# Patient Record
Sex: Male | Born: 1955 | Race: White | Hispanic: No | Marital: Married | Smoking: Never smoker
Health system: Southern US, Community
[De-identification: ages and names within clinical notes are randomized; demographics above are authoritative.]

## PROBLEM LIST (undated history)

## (undated) DIAGNOSIS — G510 Bell's palsy: Secondary | ICD-10-CM

---

## 2006-03-22 ENCOUNTER — Ambulatory Visit: Payer: Self-pay | Admitting: Internal Medicine

## 2006-03-22 LAB — CONVERTED CEMR LAB
ALT: 23 units/L (ref 0–40)
Alkaline Phosphatase: 64 units/L (ref 39–117)
BUN: 17 mg/dL (ref 6–23)
Basophils Relative: 0.5 % (ref 0.0–1.0)
Bilirubin Urine: NEGATIVE
CO2: 28 meq/L (ref 19–32)
Calcium: 9.1 mg/dL (ref 8.4–10.5)
Chloride: 109 meq/L (ref 96–112)
Chol/HDL Ratio, serum: 5.1
Cholesterol: 220 mg/dL (ref 0–200)
HDL: 43.4 mg/dL (ref >39.0)
Hemoglobin, Urine: NEGATIVE
Hemoglobin: 14.5 g/dL (ref 13.0–17.0)
Ketones, ur: NEGATIVE mg/dL
LDL DIRECT: 148.2 mg/dL
Lymphocytes Relative: 34.1 % (ref 12.0–46.0)
Neutrophils Relative %: 53.9 % (ref 43.0–77.0)
Potassium: 4.2 meq/L (ref 3.5–5.1)
TSH: 2.2 microintl units/mL (ref 0.35–5.50)
Total Protein: 6.6 g/dL (ref 6.0–8.3)
Urobilinogen, UA: 0.2 (ref 0.0–1.0)
WBC: 4.9 10*3/uL (ref 4.5–10.5)

## 2006-03-29 ENCOUNTER — Ambulatory Visit: Payer: Self-pay | Admitting: Internal Medicine

## 2007-08-14 ENCOUNTER — Encounter: Payer: Self-pay | Admitting: *Deleted

## 2007-08-14 DIAGNOSIS — Z87442 Personal history of urinary calculi: Secondary | ICD-10-CM

## 2016-08-26 ENCOUNTER — Encounter (INDEPENDENT_AMBULATORY_CARE_PROVIDER_SITE_OTHER): Payer: PRIVATE HEALTH INSURANCE | Admitting: Ophthalmology

## 2016-08-26 DIAGNOSIS — H43813 Vitreous degeneration, bilateral: Secondary | ICD-10-CM | POA: Diagnosis not present

## 2016-08-26 DIAGNOSIS — H3551 Vitreoretinal dystrophy: Secondary | ICD-10-CM

## 2016-08-26 DIAGNOSIS — D3131 Benign neoplasm of right choroid: Secondary | ICD-10-CM

## 2016-10-07 ENCOUNTER — Encounter (INDEPENDENT_AMBULATORY_CARE_PROVIDER_SITE_OTHER): Payer: PRIVATE HEALTH INSURANCE | Admitting: Ophthalmology

## 2016-10-20 ENCOUNTER — Encounter (INDEPENDENT_AMBULATORY_CARE_PROVIDER_SITE_OTHER): Payer: PRIVATE HEALTH INSURANCE | Admitting: Ophthalmology

## 2016-10-31 ENCOUNTER — Encounter (INDEPENDENT_AMBULATORY_CARE_PROVIDER_SITE_OTHER): Payer: PRIVATE HEALTH INSURANCE | Admitting: Ophthalmology

## 2016-11-03 ENCOUNTER — Encounter (INDEPENDENT_AMBULATORY_CARE_PROVIDER_SITE_OTHER): Payer: PRIVATE HEALTH INSURANCE | Admitting: Ophthalmology

## 2016-11-03 DIAGNOSIS — H43813 Vitreous degeneration, bilateral: Secondary | ICD-10-CM

## 2016-11-03 DIAGNOSIS — D3131 Benign neoplasm of right choroid: Secondary | ICD-10-CM

## 2018-03-14 DIAGNOSIS — H01004 Unspecified blepharitis left upper eyelid: Secondary | ICD-10-CM | POA: Diagnosis not present

## 2018-03-14 DIAGNOSIS — H01001 Unspecified blepharitis right upper eyelid: Secondary | ICD-10-CM | POA: Diagnosis not present

## 2018-03-14 DIAGNOSIS — H1132 Conjunctival hemorrhage, left eye: Secondary | ICD-10-CM | POA: Diagnosis not present

## 2019-02-07 ENCOUNTER — Other Ambulatory Visit: Payer: Self-pay

## 2019-02-07 ENCOUNTER — Emergency Department (HOSPITAL_COMMUNITY): Payer: BC Managed Care – PPO

## 2019-02-07 ENCOUNTER — Inpatient Hospital Stay (HOSPITAL_COMMUNITY)
Admission: EM | Admit: 2019-02-07 | Discharge: 2019-02-11 | DRG: 042 | Disposition: A | Discharge status: Home / Self Care | Payer: BC Managed Care – PPO | Attending: Internal Medicine | Admitting: Internal Medicine

## 2019-02-07 ENCOUNTER — Encounter (HOSPITAL_COMMUNITY): Payer: Self-pay | Admitting: Emergency Medicine

## 2019-02-07 DIAGNOSIS — Z823 Family history of stroke: Secondary | ICD-10-CM | POA: Diagnosis not present

## 2019-02-07 DIAGNOSIS — I454 Nonspecific intraventricular block: Secondary | ICD-10-CM | POA: Diagnosis not present

## 2019-02-07 DIAGNOSIS — I77810 Thoracic aortic ectasia: Secondary | ICD-10-CM | POA: Diagnosis not present

## 2019-02-07 DIAGNOSIS — R200 Anesthesia of skin: Secondary | ICD-10-CM | POA: Diagnosis not present

## 2019-02-07 DIAGNOSIS — G8324 Monoplegia of upper limb affecting left nondominant side: Secondary | ICD-10-CM | POA: Diagnosis present

## 2019-02-07 DIAGNOSIS — I6389 Other cerebral infarction: Secondary | ICD-10-CM | POA: Diagnosis not present

## 2019-02-07 DIAGNOSIS — R202 Paresthesia of skin: Secondary | ICD-10-CM | POA: Diagnosis not present

## 2019-02-07 DIAGNOSIS — I639 Cerebral infarction, unspecified: Secondary | ICD-10-CM | POA: Diagnosis present

## 2019-02-07 DIAGNOSIS — Z20828 Contact with and (suspected) exposure to other viral communicable diseases: Secondary | ICD-10-CM | POA: Diagnosis present

## 2019-02-07 DIAGNOSIS — Z6829 Body mass index (BMI) 29.0-29.9, adult: Secondary | ICD-10-CM | POA: Diagnosis not present

## 2019-02-07 DIAGNOSIS — G459 Transient cerebral ischemic attack, unspecified: Secondary | ICD-10-CM | POA: Diagnosis present

## 2019-02-07 DIAGNOSIS — R29700 NIHSS score 0: Secondary | ICD-10-CM | POA: Diagnosis present

## 2019-02-07 DIAGNOSIS — I63411 Cerebral infarction due to embolism of right middle cerebral artery: Secondary | ICD-10-CM | POA: Diagnosis present

## 2019-02-07 DIAGNOSIS — I6381 Other cerebral infarction due to occlusion or stenosis of small artery: Principal | ICD-10-CM | POA: Diagnosis present

## 2019-02-07 DIAGNOSIS — Z8669 Personal history of other diseases of the nervous system and sense organs: Secondary | ICD-10-CM | POA: Diagnosis not present

## 2019-02-07 DIAGNOSIS — E785 Hyperlipidemia, unspecified: Secondary | ICD-10-CM | POA: Diagnosis present

## 2019-02-07 DIAGNOSIS — E669 Obesity, unspecified: Secondary | ICD-10-CM | POA: Diagnosis present

## 2019-02-07 DIAGNOSIS — R531 Weakness: Secondary | ICD-10-CM | POA: Diagnosis not present

## 2019-02-07 DIAGNOSIS — R079 Chest pain, unspecified: Secondary | ICD-10-CM | POA: Diagnosis not present

## 2019-02-07 HISTORY — DX: Bell's palsy: G51.0

## 2019-02-07 LAB — BASIC METABOLIC PANEL
Anion gap: 7 (ref 5–15)
BUN: 15 mg/dL (ref 8–23)
CO2: 28 mmol/L (ref 22–32)
Calcium: 9.1 mg/dL (ref 8.9–10.3)
Chloride: 104 mmol/L (ref 98–111)
Creatinine, Ser: 1.04 mg/dL (ref 0.61–1.24)
GFR calc Af Amer: >60 mL/min (ref >60)
GFR calc non Af Amer: >60 mL/min (ref >60)
Glucose, Bld: 102 mg/dL — ABNORMAL HIGH (ref 70–99)
Potassium: 3.9 mmol/L (ref 3.5–5.1)
Sodium: 139 mmol/L (ref 135–145)

## 2019-02-07 LAB — CBC
HCT: 43.2 % (ref 39.0–52.0)
Hemoglobin: 14.3 g/dL (ref 13.0–17.0)
MCH: 32.7 pg (ref 26.0–34.0)
MCHC: 33.1 g/dL (ref 30.0–36.0)
MCV: 98.9 fL (ref 80.0–100.0)
Platelets: 219 10*3/uL (ref 150–400)
RBC: 4.37 MIL/uL (ref 4.22–5.81)
RDW: 13 % (ref 11.5–15.5)
WBC: 8.6 10*3/uL (ref 4.0–10.5)
nRBC: 0 % (ref 0.0–0.2)

## 2019-02-07 LAB — TROPONIN I (HIGH SENSITIVITY): Troponin I (High Sensitivity): 4 ng/L (ref <18)

## 2019-02-07 MED ORDER — ASPIRIN 81 MG PO CHEW
324.0000 mg | CHEWABLE_TABLET | Freq: Once | ORAL | Status: AC
  Administered 2019-02-07: 324 mg via ORAL
  Filled 2019-02-07: qty 4

## 2019-02-07 MED ORDER — CLOPIDOGREL BISULFATE 300 MG PO TABS
300.0000 mg | ORAL_TABLET | Freq: Once | ORAL | Status: AC
  Administered 2019-02-07: 300 mg via ORAL
  Filled 2019-02-07: qty 1

## 2019-02-07 NOTE — ED Provider Notes (Addendum)
63 year old male with no pertinent past medical history presents the ER for evaluation of left arm tingling and numbness with some left-sided chest pressure.  Patient states that it moved from his left hand to his left bicep.  Has been intermittent.  States it is not numb necessarily right now but just feels tingly.  Onset about 4 PM today.  I was notified by nursing staff to evaluate patient for possibility of code stroke.  On my examination neuro exam was performed. The patient is alert, attentive, and oriented x 3. Speech is clear. Cranial nerve II-VII grossly intact.  Peripheral vision intact.  Negative pronator drift. Sensation intact. Strength 5/5 in all extremities. Reflexes 2+ and symmetric at biceps, triceps, knees, and ankles.  No pronator drift.  Rapid alternating movement and fine finger movements intact.  Sensation is intact to his left arm.  Strength normal.  Radial pulses are 2+ bilaterally.  Capillary refill normal.  Given the patient having some chest pressure I discussed with nursing staff to start cardiac work-up for patient.  Do not feel that patient symptoms are secondary to stroke at this time.  Will need further work-up in the ER. Case dicussed with Dr. Roderic Palau who is also aware of patient.   Doristine Devoid, PA-C 02/07/19 1812    Doristine Devoid, PA-C 02/07/19 [place]    Milton Ferguson, MD 02/09/19 1038

## 2019-02-07 NOTE — ED Notes (Signed)
Pt add left sided chest pressure and "not feeling right".

## 2019-02-07 NOTE — ED Notes (Signed)
Made Tyler PA aware of pt's complaint. PA at bedside assessing patient.

## 2019-02-07 NOTE — ED Provider Notes (Signed)
[facility] DEPT Provider Note: Georgena Spurling, MD, FACEP  CSN: [id number] MRN: [id number] ARRIVAL: 02/07/19 at [facility]  02/07/19 10:58 PM Joshua Mayer is a 63 y.o. male without significant past medical history.  He has been having episodes of left upper extremity numbness and weakness beginning about 4 PM.  He estimates he has had 5 episodes, each lasting approximately 10 minutes.  The episodes consist of numbness in his left hand that sometimes extends proximally to the left shoulder.  The symptoms are severe enough that he has been completely insensate in his hand at times.  There is also associated weakness characterized as difficulty moving his hand and arm.  He has had some associated pressure in his left upper chest but denies of frank pain.  He has had no associated headache, shortness of breath, nausea, vomiting, diarrhea or diaphoresis.  He has had no involvement of the face or left lower extremity.  He has a remote history of Bell's palsy which is essentially fully resolved.  He is noted some slight swelling of his lower legs over the past week, exacerbated by prolonged sitting.    Past Medical History:  Diagnosis Date  . Bell's palsy     History reviewed. No pertinent surgical history.  No family history on file.  Social History   Tobacco Use  . Smoking status: Never Smoker  . Smokeless tobacco: Never Used  Substance Use Topics  . Alcohol use: Not Currently  . Drug use: Not on file    Prior to Admission medications   Not on File    Allergies Patient has no known allergies.   REVIEW OF SYSTEMS  Negative except as noted here or in the History of Present Illness.   PHYSICAL EXAMINATION  Initial Vital Signs Blood pressure 138/87, pulse (!) 57, temperature 98.3 F (36.8 C), temperature source Oral, resp. rate 14, SpO2 100 %.  Examination General: Well-developed, well-nourished  male in no acute distress; appearance consistent with age of record HENT: normocephalic; atraumatic Eyes: pupils equal, round and reactive to light; extraocular muscles intact Neck: supple; no bruit Heart: regular rate and rhythm; no murmur Lungs: clear to auscultation bilaterally Abdomen: soft; nondistended; nontender; bowel sounds present Extremities: No deformity; full range of motion; pulses normal Neurologic: Awake, alert and oriented; motor function intact in all extremities and symmetric; sensation intact in extremities and symmetric; no facial droop; no pronator drift; normal finger-to-nose; normal coordination and speech Skin: Warm and dry Psychiatric: Normal mood and affect   RESULTS  Summary of this visit's results, reviewed by myself:   EKG Interpretation  Date/Time:  Thursday February 07 2019 18:07:41 EDT Ventricular Rate:  61 PR Interval:    QRS Duration: 112 QT Interval:  406 QTC Calculation: 409 R Axis:   24 Text Interpretation:  Sinus rhythm Borderline intraventricular conduction delay Consider RVH or posterior infarct No previous ECGs available Confirmed by Vanilla Heatherington, Jenny Reichmann [phone]) on 02/07/2019 10:59:33 PM      Laboratory Studies: Results for orders placed or performed during the hospital encounter of 02/07/19 (from the past 24 hour(s))  Basic metabolic panel     Status: Abnormal   Collection Time: 02/07/19  6:10 PM  Result Value Ref Range   Sodium 139 135 - 145 mmol/L   Potassium 3.9 3.5 - 5.1 mmol/L   Chloride 104 98 - 111 mmol/L   CO2 28 22 - 32 mmol/L   Glucose,  Bld 102 (H) 70 - 99 mg/dL   BUN 15 8 - 23 mg/dL   Creatinine, Ser 1.04 0.61 - 1.24 mg/dL   Calcium 9.1 8.9 - 10.3 mg/dL   GFR calc non Af Amer >60 >60 mL/min   GFR calc Af Amer >60 >60 mL/min   Anion gap 7 5 - 15  CBC     Status: None   Collection Time: 02/07/19  6:10 PM  Result Value Ref Range   WBC 8.6 4.0 - 10.5 K/uL   RBC 4.37 4.22 - 5.81 MIL/uL   Hemoglobin 14.3 13.0 - 17.0 g/dL   HCT  43.2 39.0 - 52.0 %   MCV 98.9 80.0 - 100.0 fL   MCH 32.7 26.0 - 34.0 pg   MCHC 33.1 30.0 - 36.0 g/dL   RDW 13.0 11.5 - 15.5 %   Platelets 219 150 - 400 K/uL   nRBC 0.0 0.0 - 0.2 %  Troponin I (High Sensitivity)     Status: None   Collection Time: 02/07/19  6:10 PM  Result Value Ref Range   Troponin I (High Sensitivity) 4 <18 ng/L   Imaging Studies: Dg Chest 2 View  Result Date: 02/07/2019 CLINICAL DATA:  Chest pain EXAM: CHEST - 2 VIEW COMPARISON:  None. FINDINGS: Normal heart size and vascularity. No acute airspace process, pneumonia, collapse or consolidation. Negative for edema, effusion, or pneumothorax. Suspect left apical subpleural nodular scarring. No acute osseous finding. Trachea midline. IMPRESSION: No acute chest process. Electronically Signed   By: Jerilynn Mages.  Shick M.D.   On: 02/07/2019 19:19   Ct Head Wo Contrast  Result Date: 02/07/2019 CLINICAL DATA:  Acute upper extremity paresthesias EXAM: CT HEAD WITHOUT CONTRAST TECHNIQUE: Contiguous axial images were obtained from the base of the skull through the vertex without intravenous contrast. COMPARISON:  None. FINDINGS: Brain: Minor atrophy pattern. No acute intracranial hemorrhage, mass lesion, definite new infarction, midline shift, herniation, hydrocephalus, or extra-axial fluid collection. No focal mass effect or edema. Cisterns are patent. No gross cerebellar abnormality. Vascular: No hyperdense vessel. Skull: Normal. Negative for fracture or focal lesion. Sinuses/Orbits: No acute finding. Other: None. IMPRESSION: Minor atrophy.  No acute intracranial abnormality by noncontrast CT. Electronically Signed   By: Jerilynn Mages.  Shick M.D.   On: 02/07/2019 19:22    ED COURSE and MDM  Nursing notes and initial vitals signs, including pulse oximetry, reviewed.  Vitals:   02/07/19 1749 02/07/19 2240  BP: (!) 161/89 138/87  Pulse: 65 (!) 57  Resp: 16 14  Temp: 98.3 F (36.8 C)   TempSrc: Oral   SpO2: 100% 100%   11:32 PM Discussed with  Dr. Katherine Roan of neuro hospitalist service.  He recommends we load the patient with aspirin and Plavix and transfer him to [facility] for further TIA evaluation.  PROCEDURES   CRITICAL CARE Performed by: Karen Chafe Ciel Chervenak Total critical care time: 30 minutes Critical care time was exclusive of separately billable procedures and treating other patients. Critical care was necessary to treat or prevent imminent or life-threatening deterioration. Critical care was time spent personally by me on the following activities: development of treatment plan with patient and/or surrogate as well as nursing, discussions with consultants, evaluation of patient's response to treatment, examination of patient, obtaining history from patient or surrogate, ordering and performing treatments and interventions, ordering and review of laboratory studies, ordering and review of radiographic studies, pulse oximetry and re-evaluation of patient's condition.   ED DIAGNOSES     ICD-10-CM  1. TIA (transient ischemic attack)  G45.9        Zaiyden Strozier, MD 02/08/19 [phone]

## 2019-02-07 NOTE — ED Triage Notes (Signed)
Pt reports left arm tingling/numbness since around 4pm today.  Also had intermittent bilat ankle swelling.

## 2019-02-07 NOTE — ED Notes (Signed)
Patient transported to CT 

## 2019-02-08 ENCOUNTER — Observation Stay (HOSPITAL_COMMUNITY): Payer: BC Managed Care – PPO

## 2019-02-08 ENCOUNTER — Encounter (HOSPITAL_COMMUNITY): Payer: Self-pay

## 2019-02-08 ENCOUNTER — Observation Stay (HOSPITAL_BASED_OUTPATIENT_CLINIC_OR_DEPARTMENT_OTHER): Payer: BC Managed Care – PPO

## 2019-02-08 DIAGNOSIS — I63411 Cerebral infarction due to embolism of right middle cerebral artery: Secondary | ICD-10-CM | POA: Diagnosis present

## 2019-02-08 DIAGNOSIS — G459 Transient cerebral ischemic attack, unspecified: Secondary | ICD-10-CM | POA: Diagnosis not present

## 2019-02-08 DIAGNOSIS — R200 Anesthesia of skin: Secondary | ICD-10-CM | POA: Diagnosis not present

## 2019-02-08 DIAGNOSIS — I639 Cerebral infarction, unspecified: Secondary | ICD-10-CM | POA: Diagnosis not present

## 2019-02-08 DIAGNOSIS — E669 Obesity, unspecified: Secondary | ICD-10-CM | POA: Diagnosis present

## 2019-02-08 DIAGNOSIS — I6389 Other cerebral infarction: Secondary | ICD-10-CM | POA: Diagnosis not present

## 2019-02-08 DIAGNOSIS — I77810 Thoracic aortic ectasia: Secondary | ICD-10-CM | POA: Diagnosis present

## 2019-02-08 DIAGNOSIS — Z823 Family history of stroke: Secondary | ICD-10-CM | POA: Diagnosis not present

## 2019-02-08 DIAGNOSIS — Z20828 Contact with and (suspected) exposure to other viral communicable diseases: Secondary | ICD-10-CM | POA: Diagnosis present

## 2019-02-08 DIAGNOSIS — I6381 Other cerebral infarction due to occlusion or stenosis of small artery: Secondary | ICD-10-CM | POA: Diagnosis present

## 2019-02-08 DIAGNOSIS — R202 Paresthesia of skin: Secondary | ICD-10-CM | POA: Diagnosis present

## 2019-02-08 DIAGNOSIS — R531 Weakness: Secondary | ICD-10-CM | POA: Diagnosis present

## 2019-02-08 DIAGNOSIS — E785 Hyperlipidemia, unspecified: Secondary | ICD-10-CM

## 2019-02-08 DIAGNOSIS — I454 Nonspecific intraventricular block: Secondary | ICD-10-CM | POA: Diagnosis present

## 2019-02-08 DIAGNOSIS — Z8669 Personal history of other diseases of the nervous system and sense organs: Secondary | ICD-10-CM | POA: Diagnosis not present

## 2019-02-08 DIAGNOSIS — R29700 NIHSS score 0: Secondary | ICD-10-CM | POA: Diagnosis present

## 2019-02-08 DIAGNOSIS — G8324 Monoplegia of upper limb affecting left nondominant side: Secondary | ICD-10-CM | POA: Diagnosis present

## 2019-02-08 DIAGNOSIS — Z6829 Body mass index (BMI) 29.0-29.9, adult: Secondary | ICD-10-CM | POA: Diagnosis not present

## 2019-02-08 LAB — HIV ANTIBODY (ROUTINE TESTING W REFLEX): HIV Screen 4th Generation wRfx: NONREACTIVE

## 2019-02-08 LAB — COMPREHENSIVE METABOLIC PANEL
ALT: 27 U/L (ref 0–44)
AST: 21 U/L (ref 15–41)
Albumin: 3.9 g/dL (ref 3.5–5.0)
Alkaline Phosphatase: 64 U/L (ref 38–126)
Anion gap: 9 (ref 5–15)
BUN: 13 mg/dL (ref 8–23)
CO2: 26 mmol/L (ref 22–32)
Calcium: 8.9 mg/dL (ref 8.9–10.3)
Chloride: 105 mmol/L (ref 98–111)
Creatinine, Ser: 0.99 mg/dL (ref 0.61–1.24)
GFR calc Af Amer: >60 mL/min (ref >60)
GFR calc non Af Amer: >60 mL/min (ref >60)
Glucose, Bld: 97 mg/dL (ref 70–99)
Potassium: 3.9 mmol/L (ref 3.5–5.1)
Sodium: 140 mmol/L (ref 135–145)
Total Bilirubin: 0.6 mg/dL (ref 0.3–1.2)
Total Protein: 6.8 g/dL (ref 6.5–8.1)

## 2019-02-08 LAB — SARS CORONAVIRUS 2 (TAT 6-24 HRS): SARS Coronavirus 2: NEGATIVE

## 2019-02-08 LAB — CBC
HCT: 43.2 % (ref 39.0–52.0)
Hemoglobin: 14.2 g/dL (ref 13.0–17.0)
MCH: 32.2 pg (ref 26.0–34.0)
MCHC: 32.9 g/dL (ref 30.0–36.0)
MCV: 98 fL (ref 80.0–100.0)
Platelets: 214 10*3/uL (ref 150–400)
RBC: 4.41 MIL/uL (ref 4.22–5.81)
RDW: 13.1 % (ref 11.5–15.5)
WBC: 9 10*3/uL (ref 4.0–10.5)
nRBC: 0 % (ref 0.0–0.2)

## 2019-02-08 LAB — LIPID PANEL
Cholesterol: 183 mg/dL (ref 0–200)
HDL: 45 mg/dL (ref >40)
LDL Cholesterol: 118 mg/dL — ABNORMAL HIGH (ref 0–99)
Total CHOL/HDL Ratio: 4.1 RATIO
Triglycerides: 98 mg/dL (ref <150)
VLDL: 20 mg/dL (ref 0–40)

## 2019-02-08 LAB — HEMOGLOBIN A1C
Hgb A1c MFr Bld: 5.5 % (ref 4.8–5.6)
Mean Plasma Glucose: 111.15 mg/dL

## 2019-02-08 LAB — ECHOCARDIOGRAM COMPLETE

## 2019-02-08 MED ORDER — IOHEXOL 350 MG/ML SOLN
100.0000 mL | Freq: Once | INTRAVENOUS | Status: AC | PRN
  Administered 2019-02-08: 100 mL via INTRAVENOUS

## 2019-02-08 MED ORDER — ASPIRIN 325 MG PO TABS
325.0000 mg | ORAL_TABLET | Freq: Every day | ORAL | Status: DC
  Administered 2019-02-08: 325 mg via ORAL
  Filled 2019-02-08: qty 1

## 2019-02-08 MED ORDER — SODIUM CHLORIDE 0.9 % IV SOLN
INTRAVENOUS | Status: DC
  Administered 2019-02-08 (×2): via INTRAVENOUS

## 2019-02-08 MED ORDER — ACETAMINOPHEN 325 MG PO TABS
650.0000 mg | ORAL_TABLET | ORAL | Status: DC | PRN
  Administered 2019-02-08 – 2019-02-09 (×2): 650 mg via ORAL
  Filled 2019-02-08 (×2): qty 2

## 2019-02-08 MED ORDER — ACETAMINOPHEN 650 MG RE SUPP: 650.0000 mg | RECTAL | Status: DC | PRN

## 2019-02-08 MED ORDER — STROKE: EARLY STAGES OF RECOVERY BOOK
Freq: Once | Status: AC
  Administered 2019-02-08: 22:00:00
  Filled 2019-02-08 (×2): qty 1

## 2019-02-08 MED ORDER — LORAZEPAM 2 MG/ML IJ SOLN
1.0000 mg | Freq: Once | INTRAMUSCULAR | Status: AC
  Administered 2019-02-08: 11:00:00 1 mg via INTRAVENOUS

## 2019-02-08 MED ORDER — ATORVASTATIN CALCIUM 40 MG PO TABS
40.0000 mg | ORAL_TABLET | Freq: Every day | ORAL | Status: DC
  Administered 2019-02-08 – 2019-02-11 (×4): 40 mg via ORAL
  Filled 2019-02-08 (×4): qty 1

## 2019-02-08 MED ORDER — ASPIRIN 300 MG RE SUPP: 300.0000 mg | Freq: Every day | RECTAL | Status: DC

## 2019-02-08 MED ORDER — ACETAMINOPHEN 160 MG/5ML PO SOLN: 650.0000 mg | ORAL | Status: DC | PRN

## 2019-02-08 MED ORDER — SODIUM CHLORIDE (PF) 0.9 % IJ SOLN
INTRAMUSCULAR | Status: AC
  Administered 2019-02-08: 06:00:00
  Filled 2019-02-08: qty 50

## 2019-02-08 MED ORDER — ENOXAPARIN SODIUM 40 MG/0.4ML ~~LOC~~ SOLN
40.0000 mg | Freq: Every day | SUBCUTANEOUS | Status: DC
  Administered 2019-02-09 – 2019-02-11 (×3): 40 mg via SUBCUTANEOUS
  Filled 2019-02-08 (×4): qty 0.4

## 2019-02-08 MED ORDER — LORAZEPAM 2 MG/ML IJ SOLN
INTRAMUSCULAR | Status: AC
  Administered 2019-02-08: 1 mg via INTRAVENOUS
  Filled 2019-02-08: qty 1

## 2019-02-08 NOTE — ED Notes (Signed)
MRI called- pt states that he needs medication d/t feeling claustrophobic. MD notified.

## 2019-02-08 NOTE — H&P (Signed)
History and Physical    Joshua Mayer [id number] DOB: Jul 29, 1955 DOA: 02/07/2019  PCP: Patient, No Pcp Per  Patient coming from: Home.  Chief Complaint: Left upper extremity numbness and weakness.  HPI: Joshua Mayer is a 63 y.o. male with no significant past medical history presents to the ER because of multiple episodes of having left upper extremity numbness and weakness.  Patient symptoms started around 4 PM yesterday.  Patient felt a lump in his hand which started moving to his left shoulder area.  Felt weak unable to raise it up lasted for few minutes and happened at least 5 times.  Denies any associated weakness of other extremities or any visual symptoms difficulty swallowing or speaking.  Given the symptoms patient presented to the ER.  ED Course: In the ER patient was nonfocal.  CT head was unremarkable.  EKG showed normal sinus rhythm.  Chest x-ray unremarkable.  COVID-19 test is pending.  Labs show blood glucose 102 creatinine 1 hemoglobin 14.3 high-sensitivity troponin IV.  Patient after discussing with neurologist Dr. Leonel Ramsay admitted for TIA.  Review of Systems: As per HPI, rest all negative.   Past Medical History:  Diagnosis Date  . Bell's palsy     History reviewed. No pertinent surgical history.   reports that he has never smoked. He has never used smokeless tobacco. He reports previous alcohol use. No history on file for drug.  No Known Allergies  Family History  Problem Relation Age of Onset  . Transient ischemic attack Mother   . Transient ischemic attack Father     Prior to Admission medications   Not on File    Physical Exam: Constitutional: Moderately built and nourished. Vitals:   02/07/19 1749 02/07/19 2240  BP: (!) 161/89 138/87  Pulse: 65 (!) 57  Resp: 16 14  Temp: 98.3 F (36.8 C)   TempSrc: Oral   SpO2: 100% 100%   Eyes: Anicteric no pallor. ENMT: No discharge from the ears eyes nose or mouth. Neck: No mass felt.  No  neck rigidity. Respiratory: No rhonchi or crepitations. Cardiovascular: S1-S2 heard. Abdomen: Soft nontender bowel sounds present. Musculoskeletal: No edema. Skin: No rash. Neurologic: Alert awake oriented to time place and person.  Moves all extremities 5 x 5.  No facial asymmetry tongue is midline.  Pupils equal reactive light. Psychiatric: Appears normal.   Labs on Admission: I have personally reviewed following labs and imaging studies  CBC: Recent Labs  Lab 02/07/19 1810  WBC 8.6  HGB 14.3  HCT 43.2  MCV 98.9  PLT [id number]   Basic Metabolic Panel: Recent Labs  Lab 02/07/19 1810  NA 139  K 3.9  CL 104  CO2 28  GLUCOSE 102*  BUN 15  CREATININE 1.04  CALCIUM 9.1   GFR: CrCl cannot be calculated (Unknown ideal weight.). Liver Function Tests: No results for input(s): AST, ALT, ALKPHOS, BILITOT, PROT, ALBUMIN in the last 168 hours. No results for input(s): LIPASE, AMYLASE in the last 168 hours. No results for input(s): AMMONIA in the last 168 hours. Coagulation Profile: No results for input(s): INR, PROTIME in the last 168 hours. Cardiac Enzymes: No results for input(s): CKTOTAL, CKMB, CKMBINDEX, TROPONINI in the last 168 hours. BNP (last 3 results) No results for input(s): PROBNP in the last 8760 hours. HbA1C: No results for input(s): HGBA1C in the last 72 hours. CBG: No results for input(s): GLUCAP in the last 168 hours. Lipid Profile: No results for input(s): CHOL, HDL, LDLCALC, TRIG,  CHOLHDL, LDLDIRECT in the last 72 hours. Thyroid Function Tests: No results for input(s): TSH, T4TOTAL, FREET4, T3FREE, THYROIDAB in the last 72 hours. Anemia Panel: No results for input(s): VITAMINB12, FOLATE, FERRITIN, TIBC, IRON, RETICCTPCT in the last 72 hours. Urine analysis:    Component Value Date/Time   COLORURINE YELLOW 03/22/2006 0745   LABSPEC > OR = 1.030 03/22/2006 0745   PHURINE 6.0 03/22/2006 0745   GLUCOSEU NEGATIVE 03/22/2006 0745   BILIRUBINUR NEGATIVE  03/22/2006 0745   KETONESUR NEGATIVE 03/22/2006 0745   UROBILINOGEN 0.2 mg/dL 03/22/2006 0745   NITRITE Negative 03/22/2006 0745   LEUKOCYTESUR Negative 03/22/2006 0745   Sepsis Labs: @LABRCNTIP (procalcitonin:4,lacticidven:4) )No results found for this or any previous visit (from the past 240 hour(s)).   Radiological Exams on Admission: Dg Chest 2 View  Result Date: 02/07/2019 CLINICAL DATA:  Chest pain EXAM: CHEST - 2 VIEW COMPARISON:  None. FINDINGS: Normal heart size and vascularity. No acute airspace process, pneumonia, collapse or consolidation. Negative for edema, effusion, or pneumothorax. Suspect left apical subpleural nodular scarring. No acute osseous finding. Trachea midline. IMPRESSION: No acute chest process. Electronically Signed   By: Jerilynn Mages.  Shick M.D.   On: 02/07/2019 19:19   Ct Head Wo Contrast  Result Date: 02/07/2019 CLINICAL DATA:  Acute upper extremity paresthesias EXAM: CT HEAD WITHOUT CONTRAST TECHNIQUE: Contiguous axial images were obtained from the base of the skull through the vertex without intravenous contrast. COMPARISON:  None. FINDINGS: Brain: Minor atrophy pattern. No acute intracranial hemorrhage, mass lesion, definite new infarction, midline shift, herniation, hydrocephalus, or extra-axial fluid collection. No focal mass effect or edema. Cisterns are patent. No gross cerebellar abnormality. Vascular: No hyperdense vessel. Skull: Normal. Negative for fracture or focal lesion. Sinuses/Orbits: No acute finding. Other: None. IMPRESSION: Minor atrophy.  No acute intracranial abnormality by noncontrast CT. Electronically Signed   By: Jerilynn Mages.  Shick M.D.   On: 02/07/2019 19:22    EKG: Independently reviewed.  Normal sinus rhythm.  Assessment/Plan Principal Problem:   TIA (transient ischemic attack)    1. Possible TIA -discussed with on-call neurology Dr. Leonel Ramsay who advised to get a CT angiogram of the head and neck, MRI brain 2D echo.  Patient passed will be on  aspirin.  Check hemoglobin A1c lipid panel physical therapy consult.  COVID-19 test is pending.   DVT prophylaxis: Lovenox. Code Status: Full code. Family Communication: Family at the bedside. Disposition Plan: Home. Consults called: Neurologist. Admission status: Observation.   Rise Patience MD [facility] Pager [phone].  If 7PM-7AM, please contact night-coverage [email or web address] Password [email or web address]  02/08/2019, 12:02 AM

## 2019-02-08 NOTE — ED Notes (Signed)
Spouse at bedside

## 2019-02-08 NOTE — ED Notes (Signed)
Gave report to [facility], they are on the way.

## 2019-02-08 NOTE — Consult Note (Addendum)
NEUROHOSPITALIST   CONSULT   Requesting Physician: Dr. Horris Latino    Chief Complaint: LUE weakness and numbness  History obtained from:    Patient and Chart     HPI:                                                                                                                                         Joshua Mayer is an 63 y.o. male  With  PMH Bell's palsy presented to [facility] with c/o LUE weakness and numbness. His symptoms started around 4pm 02/06/2019.  He states that rather suddenly his left arm became numb.  He also noticed that there was weakness.  He was weak and unable to raise the left arm. This only lasted a few minutes then improved.  He then had multiple further episodes, with the last one involving numbness and weakness all the way up to the shoulder at which point he decided to go to the emergency department.  After arrival in the [facility] long emergency department, he was loaded with aspirin and Plavix and a CTA head neck was performed which was negative.  He has not had any further episodes.  Denies any leg weakness, CP, HA, vision changes.  No prior stroke history noted.    ED course:  CTH: no hemorrhage MRI:  CTA: no LVO   Chart review of past stroke:    Date last known well: 02/06/2019 Time last known well: 1600 tPA Given: no outside of window       Modified Rankin: Rankin Score=0 NIHSS: 0     Past Medical History:  Diagnosis Date  . Bell's palsy     History reviewed. No pertinent surgical history.  Family History  Problem Relation Age of Onset  . Transient ischemic attack Mother   . Transient ischemic attack Father          Social History:  reports that he has never smoked. He has never used smokeless tobacco. He reports previous alcohol use. No history on file for drug.  Allergies: No Known Allergies  Medications:  Current Facility-Administered Medications  Medication Dose Route Frequency Provider Last Rate Last Dose  .  stroke: mapping our early stages of recovery book   Does not apply Once Rise Patience, MD   Stopped at 02/08/19 1024  . 0.9 %  sodium chloride infusion   Intravenous Continuous Rise Patience, MD   Stopped at 02/08/19 1406  . acetaminophen (TYLENOL) tablet 650 mg  650 mg Oral Q4H PRN Rise Patience, MD       Or  . acetaminophen (TYLENOL) solution 650 mg  650 mg Per Tube Q4H PRN Rise Patience, MD       Or  . acetaminophen (TYLENOL) suppository 650 mg  650 mg Rectal Q4H PRN Rise Patience, MD      . aspirin suppository 300 mg  300 mg Rectal Daily Rise Patience, MD       Or  . aspirin tablet 325 mg  325 mg Oral Daily Rise Patience, MD   325 mg at 02/08/19 1034  . atorvastatin (LIPITOR) tablet 40 mg  40 mg Oral q1800 Alma Friendly, MD      . enoxaparin (LOVENOX) injection 40 mg  40 mg Subcutaneous Daily Rise Patience, MD       No current outpatient medications on file.    ROS:                                                                                                                                       History obtained from chart review and the patient  General ROS: negative for - chills, fatigue, fever, night sweats, weight gain or weight loss Psychological ROS: negative for - , hallucinations, memory difficulties, mood swings or  Ophthalmic ROS: negative for - blurry vision, double vision, eye pain or loss of vision ENT ROS: negative for - epistaxis, nasal discharge, oral lesions, sore throat, tinnitus or vertigo Respiratory ROS: negative for - cough,  shortness of breath or wheezing Cardiovascular ROS: negative for - chest pain, dyspnea on exertion,  Gastrointestinal ROS: negative for - abdominal pain, diarrhea,  nausea/vomiting or stool incontinence Genito-Urinary ROS:  negative for - dysuria, hematuria, incontinence or urinary frequency/urgency Musculoskeletal ROS: negative for - joint swelling or muscular weakness Neurological ROS: as noted in HPI   General Examination:                                                                                                      Blood pressure  127/76, pulse 63, temperature 98.3 F (36.8 C), temperature source Oral, resp. rate 13, SpO2 100 %.  HEENT-  Normocephalic, no lesions, without obvious abnormality.  Normal external eye and conjunctiva. Cardiovascular- S1-S2 audible, pulses palpable throughout  Lungs-no rhonchi or wheezing noted, no excessive working breathing.  Saturations within normal limits Abdomen- All 4 quadrants palpated and non tender Extremities- Warm, dry and intact Musculoskeletal-no joint tenderness, deformity or swelling Skin-warm and dry, no hyperpigmentation, vitiligo, or suspicious lesions  Neurological Examination Mental Status: Alert, oriented, thought content appropriate.  Speech fluent without evidence of aphasia.  Able to follow 3 step commands without difficulty. Cranial Nerves: II: Discs flat bilaterally; Visual fields grossly normal,  III,IV, VI: ptosis not present, extra-ocular motions intact bilaterally, left pupil is 4 mm, right pupil is 3 mm, both are reactive V,VII: smile symmetric, facial light touch sensation normal bilaterally VIII: hearing normal bilaterally IX,X: uvula rises midline XI: bilateral shoulder shrug XII: midline tongue extension Motor: Right : Upper extremity   5/5  Left:     Upper extremity   5/5  Lower extremity   5/5   Lower extremity   5/5 Tone and bulk:normal tone throughout; no atrophy noted Sensory: Pinprick and light touch intact throughout, bilaterally Deep Tendon Reflexes: 2+ and symmetric biceps and patella Plantars: Right: downgoing   Left: downgoing Cerebellar: normal finger-to-nose, normal rapid alternating movements and normal  heel-to-shin test Gait: normal gait and station   Lab Results: Basic Metabolic Panel: Recent Labs  Lab 02/07/19 1810 02/08/19 0550  NA 139 140  K 3.9 3.9  CL 104 105  CO2 28 26  GLUCOSE 102* 97  BUN 15 13  CREATININE 1.04 0.99  CALCIUM 9.1 8.9    CBC: Recent Labs  Lab 02/07/19 1810 02/08/19 0550  WBC 8.6 9.0  HGB 14.3 14.2  HCT 43.2 43.2  MCV 98.9 98.0  PLT 219 214    Lipid Panel: Recent Labs  Lab 02/08/19 0550  CHOL 183  TRIG 98  HDL 45  CHOLHDL 4.1  VLDL 20  LDLCALC 118*   Imaging: Ct Angio Head W Or Wo Contrast  Result Date: 02/08/2019 CLINICAL DATA:  63 year old male with left arm numbness and tingling since 1400 hours yesterday. EXAM: CT ANGIOGRAPHY HEAD AND NECK TECHNIQUE: Multidetector CT imaging of the head and neck was performed using the standard protocol during bolus administration of intravenous contrast. Multiplanar CT image reconstructions and MIPs were obtained to evaluate the vascular anatomy. Carotid stenosis measurements (when applicable) are obtained utilizing NASCET criteria, using the distal internal carotid diameter as the denominator. CONTRAST:  150mL OMNIPAQUE IOHEXOL 350 MG/ML SOLN COMPARISON:  Head CT 02/07/2019. FINDINGS: CT HEAD Brain: Stable gray-white matter differentiation throughout the brain. No midline shift, ventriculomegaly, mass effect, evidence of mass lesion, intracranial hemorrhage or evidence of cortically based acute infarction. Calvarium and skull base: No acute osseous abnormality identified. Paranasal sinuses: Visualized paranasal sinuses and mastoids are stable and well pneumatized. Orbits: Visualized orbits and scalp soft tissues are within normal limits. CTA NECK Skeleton: No acute osseous abnormality identified. Congenital incomplete ossification of the posterior C1 ring, normal variant. Upper chest: Negative. Other neck: Negative. Aortic arch: 3 vessel arch configuration. No arch atherosclerosis. Right carotid system:  Mildly tortuous brachiocephalic artery and proximal right CCA without plaque or stenosis. Negative right carotid bifurcation and cervical right ICA. Left carotid system: Mildly tortuous proximal left CCA. Negative left carotid bifurcation and cervical left ICA. Vertebral arteries: Negative proximal right subclavian artery. Normal right vertebral  artery origin on series 10, image 587. The right vertebral artery is mildly non dominant and patent to the skull base without stenosis. Mild proximal left subclavian artery plaque without stenosis. Normal left vertebral artery origin. Dominant left vertebral artery with mildly tortuous V1 segment which is partially obscured by dense paravertebral contrast. Otherwise the left vertebral artery is patent to the skull base without stenosis. CTA HEAD Posterior circulation: No distal vertebral stenosis, the left is dominant. Normal right PICA origin. Normal left PICA. Patent vertebrobasilar junction. Patent basilar artery is tortuous without stenosis. Normal SCA and left PCA origins. Fetal type right PCA origin. Left posterior communicating artery is diminutive or absent. Left PCA branches are within normal limits. There is mild irregularity and stenosis of the right PCA proximal P2 segment on series 14, image 53. Anterior circulation: Both ICA siphons are patent with no plaque or stenosis. Normal right posterior communicating artery origin. Patent carotid termini. Normal MCA and ACA origins. Tortuous A1 segments. Similar ectatic A2 segments. ACA branches otherwise within normal limits. Left MCA M1 segment and bifurcation are patent with tortuosity but no stenosis. Left MCA branches are within normal limits. Right MCA M1 and right MCA bifurcation are patent without stenosis. Right MCA branches are within normal limits. Venous sinuses: Patent. Anatomic variants: Dominant left vertebral artery. Fetal type right PCA origin. Review of the MIP images confirms the above findings  IMPRESSION: 1. Negative for large vessel occlusion. 2. Generalized arterial tortuosity in the head and neck, but minimal atherosclerosis and no significant arterial stenosis. 3. CT appearance of the brain is stable since yesterday. Electronically Signed   By: Genevie Ann M.D.   On: 02/08/2019 02:56   Dg Chest 2 View  Result Date: 02/07/2019 CLINICAL DATA:  Chest pain EXAM: CHEST - 2 VIEW COMPARISON:  None. FINDINGS: Normal heart size and vascularity. No acute airspace process, pneumonia, collapse or consolidation. Negative for edema, effusion, or pneumothorax. Suspect left apical subpleural nodular scarring. No acute osseous finding. Trachea midline. IMPRESSION: No acute chest process. Electronically Signed   By: Jerilynn Mages.  Shick M.D.   On: 02/07/2019 19:19   Ct Head Wo Contrast  Result Date: 02/07/2019 CLINICAL DATA:  Acute upper extremity paresthesias EXAM: CT HEAD WITHOUT CONTRAST TECHNIQUE: Contiguous axial images were obtained from the base of the skull through the vertex without intravenous contrast. COMPARISON:  None. FINDINGS: Brain: Minor atrophy pattern. No acute intracranial hemorrhage, mass lesion, definite new infarction, midline shift, herniation, hydrocephalus, or extra-axial fluid collection. No focal mass effect or edema. Cisterns are patent. No gross cerebellar abnormality. Vascular: No hyperdense vessel. Skull: Normal. Negative for fracture or focal lesion. Sinuses/Orbits: No acute finding. Other: None. IMPRESSION: Minor atrophy.  No acute intracranial abnormality by noncontrast CT. Electronically Signed   By: Jerilynn Mages.  Shick M.D.   On: 02/07/2019 19:22   Ct Angio Neck W Or Wo Contrast  Result Date: 02/08/2019 CLINICAL DATA:  63 year old male with left arm numbness and tingling since 1400 hours yesterday. EXAM: CT ANGIOGRAPHY HEAD AND NECK TECHNIQUE: Multidetector CT imaging of the head and neck was performed using the standard protocol during bolus administration of intravenous contrast.  Multiplanar CT image reconstructions and MIPs were obtained to evaluate the vascular anatomy. Carotid stenosis measurements (when applicable) are obtained utilizing NASCET criteria, using the distal internal carotid diameter as the denominator. CONTRAST:  123mL OMNIPAQUE IOHEXOL 350 MG/ML SOLN COMPARISON:  Head CT 02/07/2019. FINDINGS: CT HEAD Brain: Stable gray-white matter differentiation throughout the brain. No midline shift, ventriculomegaly, mass  effect, evidence of mass lesion, intracranial hemorrhage or evidence of cortically based acute infarction. Calvarium and skull base: No acute osseous abnormality identified. Paranasal sinuses: Visualized paranasal sinuses and mastoids are stable and well pneumatized. Orbits: Visualized orbits and scalp soft tissues are within normal limits. CTA NECK Skeleton: No acute osseous abnormality identified. Congenital incomplete ossification of the posterior C1 ring, normal variant. Upper chest: Negative. Other neck: Negative. Aortic arch: 3 vessel arch configuration. No arch atherosclerosis. Right carotid system: Mildly tortuous brachiocephalic artery and proximal right CCA without plaque or stenosis. Negative right carotid bifurcation and cervical right ICA. Left carotid system: Mildly tortuous proximal left CCA. Negative left carotid bifurcation and cervical left ICA. Vertebral arteries: Negative proximal right subclavian artery. Normal right vertebral artery origin on series 10, image 587. The right vertebral artery is mildly non dominant and patent to the skull base without stenosis. Mild proximal left subclavian artery plaque without stenosis. Normal left vertebral artery origin. Dominant left vertebral artery with mildly tortuous V1 segment which is partially obscured by dense paravertebral contrast. Otherwise the left vertebral artery is patent to the skull base without stenosis. CTA HEAD Posterior circulation: No distal vertebral stenosis, the left is dominant.  Normal right PICA origin. Normal left PICA. Patent vertebrobasilar junction. Patent basilar artery is tortuous without stenosis. Normal SCA and left PCA origins. Fetal type right PCA origin. Left posterior communicating artery is diminutive or absent. Left PCA branches are within normal limits. There is mild irregularity and stenosis of the right PCA proximal P2 segment on series 14, image 53. Anterior circulation: Both ICA siphons are patent with no plaque or stenosis. Normal right posterior communicating artery origin. Patent carotid termini. Normal MCA and ACA origins. Tortuous A1 segments. Similar ectatic A2 segments. ACA branches otherwise within normal limits. Left MCA M1 segment and bifurcation are patent with tortuosity but no stenosis. Left MCA branches are within normal limits. Right MCA M1 and right MCA bifurcation are patent without stenosis. Right MCA branches are within normal limits. Venous sinuses: Patent. Anatomic variants: Dominant left vertebral artery. Fetal type right PCA origin. Review of the MIP images confirms the above findings IMPRESSION: 1. Negative for large vessel occlusion. 2. Generalized arterial tortuosity in the head and neck, but minimal atherosclerosis and no significant arterial stenosis. 3. CT appearance of the brain is stable since yesterday. Electronically Signed   By: Genevie Ann M.D.   On: 02/08/2019 02:56       Laurey Morale, MSN, NP-C [facility] [phone]  02/08/2019, 9:26 AM   Attending physician note to follow with Assessment and plan .  I have seen the patient and reviewed the above note.  Assessment: 63 y.o. male with recurrent clinical TIAs yesterday with a punctate focus of persistent ischemia present on MRI.  The location does make me think that this could have been embolic in nature, and I would favor treating this is cryptogenic stroke.  So far work-up is been negative, but may need to consider prolonged cardiac  monitoring.    Stroke Risk Factors - none Etiology :    Recommendations: -- BP goal : Permissive HTN upto 220/110 mmHg (for 24-48 post admission ) goal is to normalize BP < 140/90 by discharge. ( post tPA is 180/148mmHg, (postMT - per Neuro IR recommendations SBP 120-140)  -- Prophylactic therapy-aspirin Plavix x3 weeks -- High intensity Statin  -- HgbA1c, fasting lipid panel -- PT consult, OT consult, Speech consult --Telemetry monitoring --Frequent neuro checks --Stroke swallow screen ( NPO  until official swallow screen if you think patient will fail)    .Roland Rack, MD [facility] [phone]  If 7pm- 7am, please page neurology on call as listed in [place].

## 2019-02-08 NOTE — ED Notes (Signed)
Attempted to call report to [facility] 3W, was placed on hold for an extended amount of time without speaking to receiving RN.

## 2019-02-08 NOTE — ED Notes (Signed)
ED TO INPATIENT HANDOFF REPORT  ED Nurse Name and Phone #: Arlyn Dunning., RN [phone]  S Name/Age/Gender Joshua Mayer 63 y.o. male Room/Bed: WA21/WA21  Code Status   Code Status: Full Code  Home/SNF/Other Home Patient oriented to: self, place, time and situation Is this baseline? Yes   Triage Complete: Triage complete  Chief Complaint Numbness   Triage Note Pt reports left arm tingling/numbness since around 4pm today.  Also had intermittent bilat ankle swelling.    Allergies No Known Allergies  Level of Care/Admitting Diagnosis ED Disposition    ED Disposition Condition [facility] Area: [place] [[id number]]  Level of Care: Telemetry Medical [104]  Covid Evaluation: Asymptomatic Screening Protocol (No Symptoms)  Diagnosis: TIA (transient ischemic attack) [id number]  Admitting Physician: Alma Friendly [id number]  Attending Physician: Alma Friendly [id number]  Estimated length of stay: past midnight tomorrow  Certification:: I certify this patient will need inpatient services for at least 2 midnights  PT Class (Do Not Modify): Inpatient [101]  PT Acc Code (Do Not Modify): Private [1]       B Medical/Surgery History Past Medical History:  Diagnosis Date  . Bell's palsy    History reviewed. No pertinent surgical history.   A IV Location/Drains/Wounds Patient Lines/Drains/Airways Status   Active Line/Drains/Airways    Name:   Placement date:   Placement time:   Site:   Days:   Peripheral IV 02/07/19 Right Forearm   02/07/19    2313    Forearm   1   Peripheral IV 02/08/19 Left Antecubital   02/08/19    0137    Antecubital   less than 1          Intake/Output Last 24 hours No intake or output data in the 24 hours ending 02/08/19 1838  Labs/Imaging Results for orders placed or performed during the hospital encounter of 02/07/19 (from the past 48 hour(s))  Basic metabolic panel     Status: Abnormal   Collection  Time: 02/07/19  6:10 PM  Result Value Ref Range   Sodium 139 135 - 145 mmol/L   Potassium 3.9 3.5 - 5.1 mmol/L   Chloride 104 98 - 111 mmol/L   CO2 28 22 - 32 mmol/L   Glucose, Bld 102 (H) 70 - 99 mg/dL   BUN 15 8 - 23 mg/dL   Creatinine, Ser 1.04 0.61 - 1.24 mg/dL   Calcium 9.1 8.9 - 10.3 mg/dL   GFR calc non Af Amer >60 >60 mL/min   GFR calc Af Amer >60 >60 mL/min   Anion gap 7 5 - 15    Comment: Performed at [facility], [place] [address] [place], [place] [address]  CBC     Status: None   Collection Time: 02/07/19  6:10 PM  Result Value Ref Range   WBC 8.6 4.0 - 10.5 K/uL   RBC 4.37 4.22 - 5.81 MIL/uL   Hemoglobin 14.3 13.0 - 17.0 g/dL   HCT 43.2 39.0 - 52.0 %   MCV 98.9 80.0 - 100.0 fL   MCH 32.7 26.0 - 34.0 pg   MCHC 33.1 30.0 - 36.0 g/dL   RDW 13.0 11.5 - 15.5 %   Platelets 219 150 - 400 K/uL   nRBC 0.0 0.0 - 0.2 %    Comment: Performed at [facility], [place] [address] [place], [place] [address]  Troponin I (High Sensitivity)     Status: None  Collection Time: 02/07/19  6:10 PM  Result Value Ref Range   Troponin I (High Sensitivity) 4 <18 ng/L    Comment: (NOTE) Elevated high sensitivity troponin I (hsTnI) values and significant  changes across serial measurements may suggest ACS but many other  chronic and acute conditions are known to elevate hsTnI results.  Refer to the "Links" section for chest pain algorithms and additional  guidance. Performed at [facility], [place] [address], [place], [place] [address]   SARS CORONAVIRUS 2 (TAT 6-24 HRS) Nasopharyngeal Nasopharyngeal Swab     Status: None   Collection Time: 02/07/19 11:21 PM   Specimen: Nasopharyngeal Swab  Result Value Ref Range   SARS Coronavirus 2 NEGATIVE NEGATIVE    Comment: (NOTE) SARS-CoV-2 target nucleic acids are NOT DETECTED. The SARS-CoV-2 RNA is generally detectable in upper and lower respiratory specimens during the acute phase of  infection. Negative results do not preclude SARS-CoV-2 infection, do not rule out co-infections with other pathogens, and should not be used as the sole basis for treatment or other patient management decisions. Negative results must be combined with clinical observations, patient history, and epidemiological information. The expected result is Negative. Fact Sheet for Patients: SugarRoll.be Fact Sheet for Healthcare Providers: https://www.woods-mathews.com/ This test is not yet approved or cleared by the [place] FDA and  has been authorized for detection and/or diagnosis of SARS-CoV-2 by FDA under an Emergency Use Authorization (EUA). This EUA will remain  in effect (meaning this test can be used) for the duration of the COVID-19 declaration under Section 56 4(b)(1) of the Act, 21 U.S.C. section 360bbb-3(b)(1), unless the authorization is terminated or revoked sooner. Performed at [facility], [place] [address] [place], [place] [address]   HIV Antibody (routine testing w rflx)     Status: None   Collection Time: 02/08/19  5:50 AM  Result Value Ref Range   HIV Screen 4th Generation wRfx NON REACTIVE NON REACTIVE    Comment: Performed at [place] [address] [place], [place] [address]  Hemoglobin A1c     Status: None   Collection Time: 02/08/19  5:50 AM  Result Value Ref Range   Hgb A1c MFr Bld 5.5 4.8 - 5.6 %    Comment: (NOTE) Pre diabetes:          5.7%-6.4% Diabetes:              >6.4% Glycemic control for   <7.0% adults with diabetes    Mean Plasma Glucose 111.15 mg/dL    Comment: Performed at [place] [address] [place], [place] [address]  Lipid panel     Status: Abnormal   Collection Time: 02/08/19  5:50 AM  Result Value Ref Range   Cholesterol 183 0 - 200 mg/dL   Triglycerides 98 <150 mg/dL   HDL 45 >40 mg/dL   Total CHOL/HDL Ratio 4.1 RATIO   VLDL 20 0 - 40 mg/dL   LDL Cholesterol  118 (H) 0 - 99 mg/dL    Comment:        Total Cholesterol/HDL:CHD Risk Coronary Heart Disease Risk Table                     Men   Women  1/2 Average Risk   3.4   3.3  Average Risk       5.0   4.4  2 X Average Risk   9.6   7.1  3  X Average Risk  23.4   11.0        Use the calculated Patient Ratio above and the CHD Risk Table to determine the patient's CHD Risk.        ATP III CLASSIFICATION (LDL):  <100     mg/dL   Optimal  100-129  mg/dL   Near or Above                    Optimal  130-159  mg/dL   Borderline  160-189  mg/dL   High  >190     mg/dL   Very High Performed at [place] [address] [place] [address] [place], [place] [address]   Comprehensive metabolic panel     Status: None   Collection Time: 02/08/19  5:50 AM  Result Value Ref Range   Sodium 140 135 - 145 mmol/L   Potassium 3.9 3.5 - 5.1 mmol/L   Chloride 105 98 - 111 mmol/L   CO2 26 22 - 32 mmol/L   Glucose, Bld 97 70 - 99 mg/dL   BUN 13 8 - 23 mg/dL   Creatinine, Ser 0.99 0.61 - 1.24 mg/dL   Calcium 8.9 8.9 - 10.3 mg/dL   Total Protein 6.8 6.5 - 8.1 g/dL   Albumin 3.9 3.5 - 5.0 g/dL   AST 21 15 - 41 U/L   ALT 27 0 - 44 U/L   Alkaline Phosphatase 64 38 - 126 U/L   Total Bilirubin 0.6 0.3 - 1.2 mg/dL   GFR calc non Af Amer >60 >60 mL/min   GFR calc Af Amer >60 >60 mL/min   Anion gap 9 5 - 15    Comment: Performed at [facility], [place] [address] [place], [place] [address]  CBC     Status: None   Collection Time: 02/08/19  5:50 AM  Result Value Ref Range   WBC 9.0 4.0 - 10.5 K/uL   RBC 4.41 4.22 - 5.81 MIL/uL   Hemoglobin 14.2 13.0 - 17.0 g/dL   HCT 43.2 39.0 - 52.0 %   MCV 98.0 80.0 - 100.0 fL   MCH 32.2 26.0 - 34.0 pg   MCHC 32.9 30.0 - 36.0 g/dL   RDW 13.1 11.5 - 15.5 %   Platelets 214 150 - 400 K/uL   nRBC 0.0 0.0 - 0.2 %    Comment: Performed at [facility], [place] [address] [place], [place] [address]   Ct Angio Head W Or Wo Contrast  Result  Date: 02/08/2019 CLINICAL DATA:  64 year old male with left arm numbness and tingling since 1400 hours yesterday. EXAM: CT ANGIOGRAPHY HEAD AND NECK TECHNIQUE: Multidetector CT imaging of the head and neck was performed using the standard protocol during bolus administration of intravenous contrast. Multiplanar CT image reconstructions and MIPs were obtained to evaluate the vascular anatomy. Carotid stenosis measurements (when applicable) are obtained utilizing NASCET criteria, using the distal internal carotid diameter as the denominator. CONTRAST:  131mL OMNIPAQUE IOHEXOL 350 MG/ML SOLN COMPARISON:  Head CT 02/07/2019. FINDINGS: CT HEAD Brain: Stable gray-white matter differentiation throughout the brain. No midline shift, ventriculomegaly, mass effect, evidence of mass lesion, intracranial hemorrhage or evidence of cortically based acute infarction. Calvarium and skull base: No acute osseous abnormality identified. Paranasal sinuses: Visualized paranasal sinuses and mastoids are stable and well pneumatized. Orbits: Visualized orbits and scalp soft tissues are within normal limits. CTA NECK Skeleton: No acute osseous abnormality identified. Congenital incomplete ossification of the posterior  C1 ring, normal variant. Upper chest: Negative. Other neck: Negative. Aortic arch: 3 vessel arch configuration. No arch atherosclerosis. Right carotid system: Mildly tortuous brachiocephalic artery and proximal right CCA without plaque or stenosis. Negative right carotid bifurcation and cervical right ICA. Left carotid system: Mildly tortuous proximal left CCA. Negative left carotid bifurcation and cervical left ICA. Vertebral arteries: Negative proximal right subclavian artery. Normal right vertebral artery origin on series 10, image 587. The right vertebral artery is mildly non dominant and patent to the skull base without stenosis. Mild proximal left subclavian artery plaque without stenosis. Normal left vertebral artery  origin. Dominant left vertebral artery with mildly tortuous V1 segment which is partially obscured by dense paravertebral contrast. Otherwise the left vertebral artery is patent to the skull base without stenosis. CTA HEAD Posterior circulation: No distal vertebral stenosis, the left is dominant. Normal right PICA origin. Normal left PICA. Patent vertebrobasilar junction. Patent basilar artery is tortuous without stenosis. Normal SCA and left PCA origins. Fetal type right PCA origin. Left posterior communicating artery is diminutive or absent. Left PCA branches are within normal limits. There is mild irregularity and stenosis of the right PCA proximal P2 segment on series 14, image 53. Anterior circulation: Both ICA siphons are patent with no plaque or stenosis. Normal right posterior communicating artery origin. Patent carotid termini. Normal MCA and ACA origins. Tortuous A1 segments. Similar ectatic A2 segments. ACA branches otherwise within normal limits. Left MCA M1 segment and bifurcation are patent with tortuosity but no stenosis. Left MCA branches are within normal limits. Right MCA M1 and right MCA bifurcation are patent without stenosis. Right MCA branches are within normal limits. Venous sinuses: Patent. Anatomic variants: Dominant left vertebral artery. Fetal type right PCA origin. Review of the MIP images confirms the above findings IMPRESSION: 1. Negative for large vessel occlusion. 2. Generalized arterial tortuosity in the head and neck, but minimal atherosclerosis and no significant arterial stenosis. 3. CT appearance of the brain is stable since yesterday. Electronically Signed   By: Genevie Ann M.D.   On: 02/08/2019 02:56   Dg Chest 2 View  Result Date: 02/07/2019 CLINICAL DATA:  Chest pain EXAM: CHEST - 2 VIEW COMPARISON:  None. FINDINGS: Normal heart size and vascularity. No acute airspace process, pneumonia, collapse or consolidation. Negative for edema, effusion, or pneumothorax. Suspect left  apical subpleural nodular scarring. No acute osseous finding. Trachea midline. IMPRESSION: No acute chest process. Electronically Signed   By: Jerilynn Mages.  Shick M.D.   On: 02/07/2019 19:19   Ct Head Wo Contrast  Result Date: 02/07/2019 CLINICAL DATA:  Acute upper extremity paresthesias EXAM: CT HEAD WITHOUT CONTRAST TECHNIQUE: Contiguous axial images were obtained from the base of the skull through the vertex without intravenous contrast. COMPARISON:  None. FINDINGS: Brain: Minor atrophy pattern. No acute intracranial hemorrhage, mass lesion, definite new infarction, midline shift, herniation, hydrocephalus, or extra-axial fluid collection. No focal mass effect or edema. Cisterns are patent. No gross cerebellar abnormality. Vascular: No hyperdense vessel. Skull: Normal. Negative for fracture or focal lesion. Sinuses/Orbits: No acute finding. Other: None. IMPRESSION: Minor atrophy.  No acute intracranial abnormality by noncontrast CT. Electronically Signed   By: Jerilynn Mages.  Shick M.D.   On: 02/07/2019 19:22   Ct Angio Neck W Or Wo Contrast  Result Date: 02/08/2019 CLINICAL DATA:  63 year old male with left arm numbness and tingling since 1400 hours yesterday. EXAM: CT ANGIOGRAPHY HEAD AND NECK TECHNIQUE: Multidetector CT imaging of the head and neck was performed using the standard  protocol during bolus administration of intravenous contrast. Multiplanar CT image reconstructions and MIPs were obtained to evaluate the vascular anatomy. Carotid stenosis measurements (when applicable) are obtained utilizing NASCET criteria, using the distal internal carotid diameter as the denominator. CONTRAST:  138mL OMNIPAQUE IOHEXOL 350 MG/ML SOLN COMPARISON:  Head CT 02/07/2019. FINDINGS: CT HEAD Brain: Stable gray-white matter differentiation throughout the brain. No midline shift, ventriculomegaly, mass effect, evidence of mass lesion, intracranial hemorrhage or evidence of cortically based acute infarction. Calvarium and skull base:  No acute osseous abnormality identified. Paranasal sinuses: Visualized paranasal sinuses and mastoids are stable and well pneumatized. Orbits: Visualized orbits and scalp soft tissues are within normal limits. CTA NECK Skeleton: No acute osseous abnormality identified. Congenital incomplete ossification of the posterior C1 ring, normal variant. Upper chest: Negative. Other neck: Negative. Aortic arch: 3 vessel arch configuration. No arch atherosclerosis. Right carotid system: Mildly tortuous brachiocephalic artery and proximal right CCA without plaque or stenosis. Negative right carotid bifurcation and cervical right ICA. Left carotid system: Mildly tortuous proximal left CCA. Negative left carotid bifurcation and cervical left ICA. Vertebral arteries: Negative proximal right subclavian artery. Normal right vertebral artery origin on series 10, image 587. The right vertebral artery is mildly non dominant and patent to the skull base without stenosis. Mild proximal left subclavian artery plaque without stenosis. Normal left vertebral artery origin. Dominant left vertebral artery with mildly tortuous V1 segment which is partially obscured by dense paravertebral contrast. Otherwise the left vertebral artery is patent to the skull base without stenosis. CTA HEAD Posterior circulation: No distal vertebral stenosis, the left is dominant. Normal right PICA origin. Normal left PICA. Patent vertebrobasilar junction. Patent basilar artery is tortuous without stenosis. Normal SCA and left PCA origins. Fetal type right PCA origin. Left posterior communicating artery is diminutive or absent. Left PCA branches are within normal limits. There is mild irregularity and stenosis of the right PCA proximal P2 segment on series 14, image 53. Anterior circulation: Both ICA siphons are patent with no plaque or stenosis. Normal right posterior communicating artery origin. Patent carotid termini. Normal MCA and ACA origins. Tortuous A1  segments. Similar ectatic A2 segments. ACA branches otherwise within normal limits. Left MCA M1 segment and bifurcation are patent with tortuosity but no stenosis. Left MCA branches are within normal limits. Right MCA M1 and right MCA bifurcation are patent without stenosis. Right MCA branches are within normal limits. Venous sinuses: Patent. Anatomic variants: Dominant left vertebral artery. Fetal type right PCA origin. Review of the MIP images confirms the above findings IMPRESSION: 1. Negative for large vessel occlusion. 2. Generalized arterial tortuosity in the head and neck, but minimal atherosclerosis and no significant arterial stenosis. 3. CT appearance of the brain is stable since yesterday. Electronically Signed   By: Genevie Ann M.D.   On: 02/08/2019 02:56   Mr Brain Wo Contrast  Result Date: 02/08/2019 CLINICAL DATA:  Focal neuro deficit, greater than 6 hours, stroke suspected. Additional history provided: Patient reports left arm tingling/numbness since approximately 4 p.m. yesterday. EXAM: MRI HEAD WITHOUT CONTRAST TECHNIQUE: Multiplanar, multiecho pulse sequences of the brain and surrounding structures were obtained without intravenous contrast. COMPARISON:  CT angiogram head/neck 02/08/2019, head CT 02/07/2019 FINDINGS: Brain: Motion degraded examination. This includes motion degradation of both the axial and coronal diffusion-weighted imaging. A punctate focus of restricted diffusion is seen within the right temporal lobe on both axial and coronal diffusion-weighted imaging (series 4, image 22) (series 9, image 24). No evidence of intracranial mass.  No midline shift or extra-axial fluid collection. Multiple small foci of SWI signal loss along left parietal lobe consistent with chronic microhemorrhages. Mild generalized parenchymal atrophy and chronic small vessel ischemic disease. Partially empty sella turcica Vascular: Flow voids maintained within the proximal large arterial vessels. Skull and  upper cervical spine: No focal marrow lesion Sinuses/Orbits: Visualized orbits demonstrate no acute abnormality. Trace ethmoid sinus mucosal thickening. No significant mastoid effusion. IMPRESSION: 1. Motion degraded examination. 2. Punctate acute infarct within the right temporal lobe. No convincing evidence of acute infarction elsewhere within the brain on motion degraded diffusion-weighted imaging. 3. Mild generalized parenchymal atrophy and chronic small vessel ischemic disease. 4. Multiple small chronic microhemorrhages within the left parietal lobe. Electronically Signed   By: Kellie Simmering   On: 02/08/2019 12:21    Pending Labs Unresulted Labs (From admission, onward)    Start     Ordered   02/15/19 0500  Creatinine, serum  (enoxaparin (LOVENOX)    CrCl >/= 30 ml/min)  Weekly,   R    Comments: while on enoxaparin therapy    02/08/19 0002   02/08/19 0500  HIV4GL Save Tube  (HIV Antibody (Routine testing w reflex) panel)  Tomorrow morning,   R     02/08/19 0002          Vitals/Pain Today's Vitals   02/08/19 1530 02/08/19 1545 02/08/19 1803 02/08/19 1805  BP: 123/85  (!) 145/85   Pulse: (!) 59 61 65 78  Resp:   18   Temp:      TempSrc:      SpO2: 100% 100% 99% 98%  Weight:      Height:      PainSc:        Isolation Precautions No active isolations  Medications Medications   stroke: mapping our early stages of recovery book (0 each Does not apply Hold 02/08/19 1024)  0.9 %  sodium chloride infusion ( Intravenous Stopped 02/08/19 1406)  acetaminophen (TYLENOL) tablet 650 mg (has no administration in time range)    Or  acetaminophen (TYLENOL) solution 650 mg (has no administration in time range)    Or  acetaminophen (TYLENOL) suppository 650 mg (has no administration in time range)  enoxaparin (LOVENOX) injection 40 mg (40 mg Subcutaneous Not Given 02/08/19 1024)  aspirin suppository 300 mg ( Rectal See Alternative 02/08/19 1034)    Or  aspirin tablet 325 mg (325 mg Oral  Given 02/08/19 1034)  atorvastatin (LIPITOR) tablet 40 mg (40 mg Oral Given 02/08/19 1809)  aspirin chewable tablet 324 mg (324 mg Oral Given 02/07/19 2349)  clopidogrel (PLAVIX) tablet 300 mg (300 mg Oral Given 02/07/19 2347)  sodium chloride (PF) 0.9 % injection (  Given by Other 02/08/19 0530)  iohexol (OMNIPAQUE) 350 MG/ML injection 100 mL (100 mLs Intravenous Contrast Given 02/08/19 0159)  LORazepam (ATIVAN) injection 1 mg (1 mg Intravenous Given 02/08/19 1118)    Mobility walks Low fall risk   Focused Assessments Neuro Assessment Handoff:  Swallow screen pass? Yes  Cardiac Rhythm: Sinus bradycardia NIH Stroke Scale ( + Modified Stroke Scale Criteria)  Interval: Shift assessment Level of Consciousness (1a.)   : Alert, keenly responsive LOC Questions (1b. )   +: Answers both questions correctly LOC Commands (1c. )   + : Performs both tasks correctly Best Gaze (2. )  +: Normal Visual (3. )  +: No visual loss Facial Palsy (4. )    : Normal symmetrical movements Motor Arm, Left (5a. )   +:  No drift Motor Arm, Right (5b. )   +: No drift Motor Leg, Left (6a. )   +: No drift Motor Leg, Right (6b. )   +: No drift Limb Ataxia (7. ): Absent Sensory (8. )   +: Normal, no sensory loss Best Language (9. )   +: No aphasia Dysarthria (10. ): Normal Extinction/Inattention (11.)   +: No Abnormality Modified SS Total  +: 0 Complete NIHSS TOTAL: 0 Last date known well: 02/07/19 Last time known well: 1600 Neuro Assessment: Within Defined Limits Neuro Checks:   Initial (02/07/19 2324)  Last Documented NIHSS Modified Score: 0 (02/08/19 0557) Has TPA been given? No If patient is a Neuro Trauma and patient is going to OR before floor call report to [place] nurse: [phone] or [phone]     R Recommendations: See Admitting Provider Note  Report given to:   Additional Notes:

## 2019-02-08 NOTE — ED Notes (Signed)
Pt transported via [facility] to [facility]

## 2019-02-08 NOTE — ED Notes (Signed)
Patient transported to MRI 

## 2019-02-08 NOTE — Progress Notes (Signed)
   Follow Up Note Please refer to H&P done earlier this morning Briefly 63 year old male with no significant past medical history presents to the ED complaining of multiple episodes of left upper extremity numbness and weakness.  Today, patient denies any further numbness, muscle strength 5/5 equal in all extremities.  Patient is currently being managed for TIA as CT head, CTA head and neck are currently unremarkable.  MRI pending  Exam: CV: S1, S2 present Lungs: CTA B Abd: Soft, nontender, nondistended, bowel sounds present Ext: No pedal edema bilaterally Neurology: Muscle strength 5/5 equal in all extremities, no obvious focal neurologic deficits noted  Present on Admission: . TIA (transient ischemic attack)   Plan: MRI brain pending CTA head and neck unremarkable Echo with EF 60 to 65% A1c 5.5, LDL 118 Started on Lipitor 40 mg daily Passed swallow screen PT/OT pending Neurology on board, recommend transfer to [facility]

## 2019-02-08 NOTE — Progress Notes (Signed)
Echocardiogram 2D Echocardiogram has been performed.  Oneal Deputy Porshea Janowski 02/08/2019, 8:45 AM

## 2019-02-08 NOTE — ED Notes (Signed)
Pt ambulatory to restroom and back to bed. 

## 2019-02-09 ENCOUNTER — Inpatient Hospital Stay (HOSPITAL_COMMUNITY): Payer: BC Managed Care – PPO

## 2019-02-09 DIAGNOSIS — I63411 Cerebral infarction due to embolism of right middle cerebral artery: Secondary | ICD-10-CM

## 2019-02-09 DIAGNOSIS — I6389 Other cerebral infarction: Secondary | ICD-10-CM | POA: Diagnosis not present

## 2019-02-09 DIAGNOSIS — I639 Cerebral infarction, unspecified: Secondary | ICD-10-CM

## 2019-02-09 DIAGNOSIS — E785 Hyperlipidemia, unspecified: Secondary | ICD-10-CM | POA: Diagnosis not present

## 2019-02-09 MED ORDER — LORAZEPAM 2 MG/ML IJ SOLN
0.5000 mg | Freq: Once | INTRAMUSCULAR | Status: AC
  Administered 2019-02-09: 0.5 mg via INTRAVENOUS

## 2019-02-09 MED ORDER — CLOPIDOGREL BISULFATE 75 MG PO TABS
75.0000 mg | ORAL_TABLET | Freq: Every day | ORAL | Status: DC
  Administered 2019-02-09 – 2019-02-11 (×3): 75 mg via ORAL
  Filled 2019-02-09 (×3): qty 1

## 2019-02-09 MED ORDER — LORAZEPAM 2 MG/ML IJ SOLN
INTRAMUSCULAR | Status: AC
  Filled 2019-02-09: qty 1

## 2019-02-09 MED ORDER — LORAZEPAM 2 MG/ML IJ SOLN: 1.0000 mg | Freq: Once | INTRAMUSCULAR | Status: DC

## 2019-02-09 MED ORDER — ASPIRIN EC 81 MG PO TBEC
81.0000 mg | DELAYED_RELEASE_TABLET | Freq: Every day | ORAL | Status: DC
  Administered 2019-02-09 – 2019-02-11 (×3): 81 mg via ORAL
  Filled 2019-02-09 (×3): qty 1

## 2019-02-09 NOTE — Progress Notes (Signed)
Physical Therapy Note  Spoke with occupational therapy after initial evaluation. OT reports patient is functioning at a high level of independence, has been asymptomatic, and no physical therapy is indicated at this time. PT is signing-off. Please re-order if there are any significant changes in status. Thank you for this referral.  Elayne Snare, PT

## 2019-02-09 NOTE — Progress Notes (Signed)
VASCULAR LAB PRELIMINARY  PRELIMINARY  PRELIMINARY  PRELIMINARY  Bilateral lower extremity venous duplex completed.    Preliminary report:  See CV proc for preliminary results.   Lashon Hillier, RVT 02/09/2019, 5:37 PM

## 2019-02-09 NOTE — Progress Notes (Signed)
PROGRESS NOTE    Joshua Mayer  [id number] DOB: 1955/06/30 DOA: 02/07/2019 PCP: Patient, No Pcp Per  Outpatient Specialists:     Brief Narrative: Patient is a 63 year old Caucasian male past medical history significant for Bell's palsy.  Patient was admitted with left upper extremity numbness and weakness that started on the day of presentation.  Initial MRI of the brain done on 02/08/2019 revealed acute punctate infarct within the right temporal lobe.  However, the examination was said to be motion degraded.  Multiple small chronic microhemorrhages within the left parietal lobe was also reported.  Patient had repeat MRI of the brain done today, 02/09/2019, that confirmed presence of acute punctate infection in the lateral right temporal lobe but also suggested other tiny acute infarctions along the surface of the insula and at the right temporoparietal junction.  The imaging was said to indicate small show of microembolic infarctions in the right MCA territory.  Communicated with neurology team, TEE and possible loop device is planned for Monday.  Doppler ultrasound of the lower extremities came back negative for DVT.  Patient is currently on aspirin, Plavix and atorvastatin.  Lipid profile revealed total cholesterol of 183, HDL of 45 and LDL of 118.  Further management will depend on hospital course.  Assessment & Plan:   Principal Problem:   TIA (transient ischemic attack)  TIA: MRI brain result is noted. For further work-up. TEE is planned. Loop recorder is also planned. Continue aspirin and Plavix for now. Continue atorvastatin. Continue to monitor blood pressure closely. Further management depend on hospital course.   DVT prophylaxis: Subcutaneous Lovenox Code Status: Full code Family Communication:  Disposition Plan: Home eventually   Consultants:   Neurology  Procedures:   None  Antimicrobials:   None   Subjective: No new complaints. Left upper  extremity weakness and numbness have resolved. No palpitations.  Objective: Vitals:   02/09/19 0430 02/09/19 0630 02/09/19 0748 02/09/19 1143  BP: (!) 129/105 126/83 124/86 121/80  Pulse: (!) 56 (!) 57 (!) 54 (!) 58  Resp:  15 14 16   Temp: 97.8 F (36.6 C) 98 F (36.7 C) 97.6 F (36.4 C) 98.4 F (36.9 C)  TempSrc: Oral Oral Oral Oral  SpO2: 100% 98% 100% 97%  Weight:      Height:        Intake/Output Summary (Last 24 hours) at 02/09/2019 1342 Last data filed at 02/09/2019 0600 Gross per 24 hour  Intake 729.42 ml  Output 425 ml  Net 304.42 ml   Filed Weights   02/08/19 1255  Weight: 99.8 kg    Examination:  General exam: Appears calm and comfortable  Respiratory system: Clear to auscultation. Respiratory effort normal. Cardiovascular system: S1 & S2 heard Gastrointestinal system: Abdomen is nondistended, soft and nontender. No organomegaly or masses felt. Normal bowel sounds heard. Central nervous system: Awake and alert.  Patient moves all extremities.   Extremities: No leg edema.  Data Reviewed: I have personally reviewed following labs and imaging studies  CBC: Recent Labs  Lab 02/07/19 1810 02/08/19 0550  WBC 8.6 9.0  HGB 14.3 14.2  HCT 43.2 43.2  MCV 98.9 98.0  PLT 219 [id number]   Basic Metabolic Panel: Recent Labs  Lab 02/07/19 1810 02/08/19 0550  NA 139 140  K 3.9 3.9  CL 104 105  CO2 28 26  GLUCOSE 102* 97  BUN 15 13  CREATININE 1.04 0.99  CALCIUM 9.1 8.9   GFR: Estimated Creatinine Clearance: 92.2 mL/min (by C-G  formula based on SCr of 0.99 mg/dL). Liver Function Tests: Recent Labs  Lab 02/08/19 0550  AST 21  ALT 27  ALKPHOS 64  BILITOT 0.6  PROT 6.8  ALBUMIN 3.9   No results for input(s): LIPASE, AMYLASE in the last 168 hours. No results for input(s): AMMONIA in the last 168 hours. Coagulation Profile: No results for input(s): INR, PROTIME in the last 168 hours. Cardiac Enzymes: No results for input(s): CKTOTAL, CKMB, CKMBINDEX,  TROPONINI in the last 168 hours. BNP (last 3 results) No results for input(s): PROBNP in the last 8760 hours. HbA1C: Recent Labs    02/08/19 0550  HGBA1C 5.5   CBG: No results for input(s): GLUCAP in the last 168 hours. Lipid Profile: Recent Labs    02/08/19 0550  CHOL 183  HDL 45  LDLCALC 118*  TRIG 98  CHOLHDL 4.1   Thyroid Function Tests: No results for input(s): TSH, T4TOTAL, FREET4, T3FREE, THYROIDAB in the last 72 hours. Anemia Panel: No results for input(s): VITAMINB12, FOLATE, FERRITIN, TIBC, IRON, RETICCTPCT in the last 72 hours. Urine analysis:    Component Value Date/Time   COLORURINE YELLOW 03/22/2006 0745   LABSPEC > OR = 1.030 03/22/2006 0745   PHURINE 6.0 03/22/2006 0745   GLUCOSEU NEGATIVE 03/22/2006 0745   BILIRUBINUR NEGATIVE 03/22/2006 0745   KETONESUR NEGATIVE 03/22/2006 0745   UROBILINOGEN 0.2 mg/dL 03/22/2006 0745   NITRITE Negative 03/22/2006 0745   LEUKOCYTESUR Negative 03/22/2006 0745   Sepsis Labs: @LABRCNTIP (procalcitonin:4,lacticidven:4)  ) Recent Results (from the past 240 hour(s))  SARS CORONAVIRUS 2 (TAT 6-24 HRS) Nasopharyngeal Nasopharyngeal Swab     Status: None   Collection Time: 02/07/19 11:21 PM   Specimen: Nasopharyngeal Swab  Result Value Ref Range Status   SARS Coronavirus 2 NEGATIVE NEGATIVE Final    Comment: (NOTE) SARS-CoV-2 target nucleic acids are NOT DETECTED. The SARS-CoV-2 RNA is generally detectable in upper and lower respiratory specimens during the acute phase of infection. Negative results do not preclude SARS-CoV-2 infection, do not rule out co-infections with other pathogens, and should not be used as the sole basis for treatment or other patient management decisions. Negative results must be combined with clinical observations, patient history, and epidemiological information. The expected result is Negative. Fact Sheet for Patients: SugarRoll.be Fact Sheet for Healthcare  Providers: https://www.woods-mathews.com/ This test is not yet approved or cleared by the [place] FDA and  has been authorized for detection and/or diagnosis of SARS-CoV-2 by FDA under an Emergency Use Authorization (EUA). This EUA will remain  in effect (meaning this test can be used) for the duration of the COVID-19 declaration under Section 56 4(b)(1) of the Act, 21 U.S.C. section 360bbb-3(b)(1), unless the authorization is terminated or revoked sooner. Performed at [facility], [place] [address], [place], [place] [address]          Radiology Studies: Ct Angio Head W Or Wo Contrast  Result Date: 02/08/2019 CLINICAL DATA:  63 year old male with left arm numbness and tingling since 1400 hours yesterday. EXAM: CT ANGIOGRAPHY HEAD AND NECK TECHNIQUE: Multidetector CT imaging of the head and neck was performed using the standard protocol during bolus administration of intravenous contrast. Multiplanar CT image reconstructions and MIPs were obtained to evaluate the vascular anatomy. Carotid stenosis measurements (when applicable) are obtained utilizing NASCET criteria, using the distal internal carotid diameter as the denominator. CONTRAST:  119mL OMNIPAQUE IOHEXOL 350 MG/ML SOLN COMPARISON:  Head CT 02/07/2019. FINDINGS: CT HEAD Brain: Stable gray-white matter differentiation throughout the  brain. No midline shift, ventriculomegaly, mass effect, evidence of mass lesion, intracranial hemorrhage or evidence of cortically based acute infarction. Calvarium and skull base: No acute osseous abnormality identified. Paranasal sinuses: Visualized paranasal sinuses and mastoids are stable and well pneumatized. Orbits: Visualized orbits and scalp soft tissues are within normal limits. CTA NECK Skeleton: No acute osseous abnormality identified. Congenital incomplete ossification of the posterior C1 ring, normal variant. Upper chest: Negative. Other neck: Negative. Aortic arch: 3 vessel  arch configuration. No arch atherosclerosis. Right carotid system: Mildly tortuous brachiocephalic artery and proximal right CCA without plaque or stenosis. Negative right carotid bifurcation and cervical right ICA. Left carotid system: Mildly tortuous proximal left CCA. Negative left carotid bifurcation and cervical left ICA. Vertebral arteries: Negative proximal right subclavian artery. Normal right vertebral artery origin on series 10, image 587. The right vertebral artery is mildly non dominant and patent to the skull base without stenosis. Mild proximal left subclavian artery plaque without stenosis. Normal left vertebral artery origin. Dominant left vertebral artery with mildly tortuous V1 segment which is partially obscured by dense paravertebral contrast. Otherwise the left vertebral artery is patent to the skull base without stenosis. CTA HEAD Posterior circulation: No distal vertebral stenosis, the left is dominant. Normal right PICA origin. Normal left PICA. Patent vertebrobasilar junction. Patent basilar artery is tortuous without stenosis. Normal SCA and left PCA origins. Fetal type right PCA origin. Left posterior communicating artery is diminutive or absent. Left PCA branches are within normal limits. There is mild irregularity and stenosis of the right PCA proximal P2 segment on series 14, image 53. Anterior circulation: Both ICA siphons are patent with no plaque or stenosis. Normal right posterior communicating artery origin. Patent carotid termini. Normal MCA and ACA origins. Tortuous A1 segments. Similar ectatic A2 segments. ACA branches otherwise within normal limits. Left MCA M1 segment and bifurcation are patent with tortuosity but no stenosis. Left MCA branches are within normal limits. Right MCA M1 and right MCA bifurcation are patent without stenosis. Right MCA branches are within normal limits. Venous sinuses: Patent. Anatomic variants: Dominant left vertebral artery. Fetal type right PCA  origin. Review of the MIP images confirms the above findings IMPRESSION: 1. Negative for large vessel occlusion. 2. Generalized arterial tortuosity in the head and neck, but minimal atherosclerosis and no significant arterial stenosis. 3. CT appearance of the brain is stable since yesterday. Electronically Signed   By: Genevie Ann M.D.   On: 02/08/2019 02:56   Dg Chest 2 View  Result Date: 02/07/2019 CLINICAL DATA:  Chest pain EXAM: CHEST - 2 VIEW COMPARISON:  None. FINDINGS: Normal heart size and vascularity. No acute airspace process, pneumonia, collapse or consolidation. Negative for edema, effusion, or pneumothorax. Suspect left apical subpleural nodular scarring. No acute osseous finding. Trachea midline. IMPRESSION: No acute chest process. Electronically Signed   By: Jerilynn Mages.  Shick M.D.   On: 02/07/2019 19:19   Ct Head Wo Contrast  Result Date: 02/07/2019 CLINICAL DATA:  Acute upper extremity paresthesias EXAM: CT HEAD WITHOUT CONTRAST TECHNIQUE: Contiguous axial images were obtained from the base of the skull through the vertex without intravenous contrast. COMPARISON:  None. FINDINGS: Brain: Minor atrophy pattern. No acute intracranial hemorrhage, mass lesion, definite new infarction, midline shift, herniation, hydrocephalus, or extra-axial fluid collection. No focal mass effect or edema. Cisterns are patent. No gross cerebellar abnormality. Vascular: No hyperdense vessel. Skull: Normal. Negative for fracture or focal lesion. Sinuses/Orbits: No acute finding. Other: None. IMPRESSION: Minor atrophy.  No acute intracranial  abnormality by noncontrast CT. Electronically Signed   By: Jerilynn Mages.  Shick M.D.   On: 02/07/2019 19:22   Ct Angio Neck W Or Wo Contrast  Result Date: 02/08/2019 CLINICAL DATA:  63 year old male with left arm numbness and tingling since 1400 hours yesterday. EXAM: CT ANGIOGRAPHY HEAD AND NECK TECHNIQUE: Multidetector CT imaging of the head and neck was performed using the standard protocol  during bolus administration of intravenous contrast. Multiplanar CT image reconstructions and MIPs were obtained to evaluate the vascular anatomy. Carotid stenosis measurements (when applicable) are obtained utilizing NASCET criteria, using the distal internal carotid diameter as the denominator. CONTRAST:  115mL OMNIPAQUE IOHEXOL 350 MG/ML SOLN COMPARISON:  Head CT 02/07/2019. FINDINGS: CT HEAD Brain: Stable gray-white matter differentiation throughout the brain. No midline shift, ventriculomegaly, mass effect, evidence of mass lesion, intracranial hemorrhage or evidence of cortically based acute infarction. Calvarium and skull base: No acute osseous abnormality identified. Paranasal sinuses: Visualized paranasal sinuses and mastoids are stable and well pneumatized. Orbits: Visualized orbits and scalp soft tissues are within normal limits. CTA NECK Skeleton: No acute osseous abnormality identified. Congenital incomplete ossification of the posterior C1 ring, normal variant. Upper chest: Negative. Other neck: Negative. Aortic arch: 3 vessel arch configuration. No arch atherosclerosis. Right carotid system: Mildly tortuous brachiocephalic artery and proximal right CCA without plaque or stenosis. Negative right carotid bifurcation and cervical right ICA. Left carotid system: Mildly tortuous proximal left CCA. Negative left carotid bifurcation and cervical left ICA. Vertebral arteries: Negative proximal right subclavian artery. Normal right vertebral artery origin on series 10, image 587. The right vertebral artery is mildly non dominant and patent to the skull base without stenosis. Mild proximal left subclavian artery plaque without stenosis. Normal left vertebral artery origin. Dominant left vertebral artery with mildly tortuous V1 segment which is partially obscured by dense paravertebral contrast. Otherwise the left vertebral artery is patent to the skull base without stenosis. CTA HEAD Posterior circulation: No  distal vertebral stenosis, the left is dominant. Normal right PICA origin. Normal left PICA. Patent vertebrobasilar junction. Patent basilar artery is tortuous without stenosis. Normal SCA and left PCA origins. Fetal type right PCA origin. Left posterior communicating artery is diminutive or absent. Left PCA branches are within normal limits. There is mild irregularity and stenosis of the right PCA proximal P2 segment on series 14, image 53. Anterior circulation: Both ICA siphons are patent with no plaque or stenosis. Normal right posterior communicating artery origin. Patent carotid termini. Normal MCA and ACA origins. Tortuous A1 segments. Similar ectatic A2 segments. ACA branches otherwise within normal limits. Left MCA M1 segment and bifurcation are patent with tortuosity but no stenosis. Left MCA branches are within normal limits. Right MCA M1 and right MCA bifurcation are patent without stenosis. Right MCA branches are within normal limits. Venous sinuses: Patent. Anatomic variants: Dominant left vertebral artery. Fetal type right PCA origin. Review of the MIP images confirms the above findings IMPRESSION: 1. Negative for large vessel occlusion. 2. Generalized arterial tortuosity in the head and neck, but minimal atherosclerosis and no significant arterial stenosis. 3. CT appearance of the brain is stable since yesterday. Electronically Signed   By: Genevie Ann M.D.   On: 02/08/2019 02:56   Mr Brain Wo Contrast  Result Date: 02/09/2019 CLINICAL DATA:  Follow-up stroke. Punctate right temporal lobe infarction shown on motion degraded study yesterday. EXAM: MRI HEAD WITHOUT CONTRAST TECHNIQUE: Multiplanar, multiecho pulse sequences of the brain and surrounding structures were obtained without intravenous contrast. COMPARISON:  02/08/2019 FINDINGS: Brain: Diffusion imaging is repeated today. Today's study also suffers from some motion degradation. The study confirms the presence of a punctate acute infarction in  the right lateral temporal lobe. One could question a few other tiny punctate foci of restricted diffusion in the right middle cerebral artery territory, particularly along the surface of the insula and in the right temporoparietal junction region, that suggest that we are dealing with a small shower of micro emboli in the right middle cerebral artery territory. No large or confluent infarction. No evidence swelling, mass effect or hemorrhage. IMPRESSION: Repeat diffusion imaging confirms the presence of a punctate acute infarction in the lateral right temporal lobe and suggests other tiny acute infarctions along the surface of the insula and at the right temporoparietal junction. This would indicate that we are dealing with a small shower of micro embolic infarctions in the right MCA territory. Electronically Signed   By: Nelson Chimes M.D.   On: 02/09/2019 11:06   Mr Brain Wo Contrast  Result Date: 02/08/2019 CLINICAL DATA:  Focal neuro deficit, greater than 6 hours, stroke suspected. Additional history provided: Patient reports left arm tingling/numbness since approximately 4 p.m. yesterday. EXAM: MRI HEAD WITHOUT CONTRAST TECHNIQUE: Multiplanar, multiecho pulse sequences of the brain and surrounding structures were obtained without intravenous contrast. COMPARISON:  CT angiogram head/neck 02/08/2019, head CT 02/07/2019 FINDINGS: Brain: Motion degraded examination. This includes motion degradation of both the axial and coronal diffusion-weighted imaging. A punctate focus of restricted diffusion is seen within the right temporal lobe on both axial and coronal diffusion-weighted imaging (series 4, image 22) (series 9, image 24). No evidence of intracranial mass. No midline shift or extra-axial fluid collection. Multiple small foci of SWI signal loss along left parietal lobe consistent with chronic microhemorrhages. Mild generalized parenchymal atrophy and chronic small vessel ischemic disease. Partially empty  sella turcica Vascular: Flow voids maintained within the proximal large arterial vessels. Skull and upper cervical spine: No focal marrow lesion Sinuses/Orbits: Visualized orbits demonstrate no acute abnormality. Trace ethmoid sinus mucosal thickening. No significant mastoid effusion. IMPRESSION: 1. Motion degraded examination. 2. Punctate acute infarct within the right temporal lobe. No convincing evidence of acute infarction elsewhere within the brain on motion degraded diffusion-weighted imaging. 3. Mild generalized parenchymal atrophy and chronic small vessel ischemic disease. 4. Multiple small chronic microhemorrhages within the left parietal lobe. Electronically Signed   By: Kellie Simmering   On: 02/08/2019 12:21        Scheduled Meds:  aspirin EC  81 mg Oral Daily   atorvastatin  40 mg Oral q1800   clopidogrel  75 mg Oral Daily   enoxaparin (LOVENOX) injection  40 mg Subcutaneous Daily   LORazepam       LORazepam  1 mg Intravenous Once   Continuous Infusions:   LOS: 1 day    Time spent: 25 minutes.    Dana Allan, MD  [facility] Pager #: [phone] 7PM-7AM contact night coverage as above

## 2019-02-09 NOTE — Evaluation (Signed)
Occupational Therapy Evaluation Patient Details Name: Joshua Mayer MRN: [id number] DOB: January 09, 1956 Today's Date: 02/09/2019    History of Present Illness  63 y.o. male  With  PMH Bell's palsy presented to [facility] hospital with c/o LUE weakness and numbness.  CTA head neck was performed which was negative.   Clinical Impression   Pt admitted with the above diagnoses. Pt currently at baseline independent with ADLs. LUE numbness and weakness appear to have resolved. No further acute OT needs indicated OT signing off.     Follow Up Recommendations  No OT follow up    Equipment Recommendations  None recommended by OT    Recommendations for Other Services       Precautions / Restrictions Restrictions Weight Bearing Restrictions: No      Mobility Bed Mobility Overal bed mobility: Independent                Transfers Overall transfer level: Independent Equipment used: None                  Balance Overall balance assessment: Independent                                         ADL either performed or assessed with clinical judgement   ADL Overall ADL's : Independent                                       General ADL Comments: Pt reports resolution of symptoms. Completed household distance functional mobility incorporating stairs and navigating obstacles. LUE WNL strength, sensation and coordination.      Vision Baseline Vision/History: Wears glasses Wears Glasses: Reading only Patient Visual Report: No change from baseline       Perception     Praxis      Pertinent Vitals/Pain Pain Assessment: Faces Faces Pain Scale: Hurts a little bit Pain Location: headache. pt suspects related to med given to him for MRI Pain Descriptors / Indicators: Aching Pain Intervention(s): RN gave pain meds during session;Monitored during session     Hand Dominance Right   Extremity/Trunk Assessment Upper Extremity Assessment Upper  Extremity Assessment: Overall WFL for tasks assessed   Lower Extremity Assessment Lower Extremity Assessment: Overall WFL for tasks assessed   Cervical / Trunk Assessment Cervical / Trunk Assessment: Normal   Communication Communication Communication: No difficulties   Cognition Arousal/Alertness: Awake/alert Behavior During Therapy: WFL for tasks assessed/performed Overall Cognitive Status: Within Functional Limits for tasks assessed                                     General Comments  Pt with 1 minor LOB upon initial stand from bed. Able to steady self with single extremity support, "I haven't stood up much the past few days."    Exercises     Shoulder Instructions      Home Living Family/patient expects to be discharged to:: Private residence Living Arrangements: Spouse/significant other Available Help at Discharge: Family Type of Home: House Home Access: Stairs to enter [employer] of Steps: "just a few"   Home Layout: One level     Bathroom Shower/Tub: Tub/shower unit  Prior Functioning/Environment Level of Independence: Independent        Comments: works from home        OT Problem List:        OT Treatment/Interventions:      OT Goals(Current goals can be found in the care plan section) Acute Rehab OT Goals Patient Stated Goal: home  OT Frequency:     Barriers to D/C:            Co-evaluation              AM-PAC OT "6 Clicks" Daily Activity     Outcome Measure Help from another person eating meals?: None Help from another person taking care of personal grooming?: None Help from another person toileting, which includes using toliet, bedpan, or urinal?: None Help from another person bathing (including washing, rinsing, drying)?: None Help from another person to put on and taking off regular upper body clothing?: None Help from another person to put on and taking off regular lower body  clothing?: None 6 Click Score: 24   End of Session    Activity Tolerance: Patient tolerated treatment well Patient left: in bed;with call bell/phone within reach  OT Visit Diagnosis: Other (comment)(LUE decreased sensation and weakness)                Time: [id number] OT Time Calculation (min): 14 min Charges:  OT General Charges $OT Visit: 1 Visit OT Evaluation $OT Eval Low Complexity: 1 Low  Tyrone Schimke, OT Acute Rehabilitation Services Pager: [phone] Office: [phone]   Hortencia Pilar 02/09/2019, 9:13 AM

## 2019-02-09 NOTE — Progress Notes (Signed)
STROKE TEAM PROGRESS NOTE   INTERVAL HISTORY His wife is at the bedside.  Pt symptom resolved. And neuro intact. Repeat limited MRI showed punctate infarcts at right temporal lobe and right juxtacortical region, concerning for embolic. Discussed with pt and wife, they agree with TEE and loop on Monday.   OBJECTIVE Vitals:   02/09/19 0430 02/09/19 0630 02/09/19 0748 02/09/19 1143  BP: (!) 129/105 126/83 124/86 121/80  Pulse: (!) 56 (!) 57 (!) 54 (!) 58  Resp:  15 14 16   Temp: 97.8 F (36.6 C) 98 F (36.7 C) 97.6 F (36.4 C) 98.4 F (36.9 C)  TempSrc: Oral Oral Oral Oral  SpO2: 100% 98% 100% 97%  Weight:      Height:        CBC:  Recent Labs  Lab 02/07/19 1810 02/08/19 0550  WBC 8.6 9.0  HGB 14.3 14.2  HCT 43.2 43.2  MCV 98.9 98.0  PLT 219 [id number]    Basic Metabolic Panel:  Recent Labs  Lab 02/07/19 1810 02/08/19 0550  NA 139 140  K 3.9 3.9  CL 104 105  CO2 28 26  GLUCOSE 102* 97  BUN 15 13  CREATININE 1.04 0.99  CALCIUM 9.1 8.9    Lipid Panel:     Component Value Date/Time   CHOL 183 02/08/2019 0550   TRIG 98 02/08/2019 0550   TRIG 92 03/22/2006 0745   HDL 45 02/08/2019 0550   CHOLHDL 4.1 02/08/2019 0550   VLDL 20 02/08/2019 0550   LDLCALC 118 (H) 02/08/2019 0550   HgbA1c:  Lab Results  Component Value Date   HGBA1C 5.5 02/08/2019   Urine Drug Screen: No results found for: LABOPIA, COCAINSCRNUR, LABBENZ, AMPHETMU, THCU, LABBARB  Alcohol Level No results found for: ETH  IMAGING  Ct Angio Head W Or Wo Contrast Ct Angio Neck W Or Wo Contrast 02/08/2019 IMPRESSION:  1. Negative for large vessel occlusion.  2. Generalized arterial tortuosity in the head and neck, but minimal atherosclerosis and no significant arterial stenosis.  3. CT appearance of the brain is stable since yesterday.   Dg Chest 2 View 02/07/2019 IMPRESSION:  No acute chest process.   Ct Head Wo Contrast 02/07/2019 IMPRESSION:  Minor atrophy.  No acute intracranial  abnormality by noncontrast CT.   Mr Brain Wo Contrast 02/08/2019 IMPRESSION:  1. Motion degraded examination.  2. Punctate acute infarct within the right temporal lobe. No convincing evidence of acute infarction elsewhere within the brain on motion degraded diffusion-weighted imaging.  3. Mild generalized parenchymal atrophy and chronic small vessel ischemic disease.  4. Multiple small chronic microhemorrhages within the left parietal lobe.   MRI Brain WO Contrast (repeat) 02/09/2019 IMPRESSION: Repeat diffusion imaging confirms the presence of a punctate acute infarction in the lateral right temporal lobe and suggests other tiny acute infarctions along the surface of the insula and at the right temporoparietal junction. This would indicate that we are dealing with a small shower of micro embolic infarctions in the right MCA territory.   Transthoracic Echocardiogram  02/08/2019 IMPRESSIONS  1. Left ventricular ejection fraction, by visual estimation, is 60 to 65%. The left ventricle has normal function. There is no left ventricular hypertrophy.  2. Global right ventricle has normal systolic function.The right ventricular size is normal. No increase in right ventricular wall thickness.  3. Left atrial size was normal.  4. Right atrial size was normal.  5. The mitral valve is normal in structure. Trace mitral valve regurgitation.  6. The tricuspid  valve is normal in structure. Tricuspid valve regurgitation is trivial.  7. The aortic valve is normal in structure. Aortic valve regurgitation was not visualized by color flow Doppler.  8. The pulmonic valve was normal in structure. Pulmonic valve regurgitation is not visualized by color flow Doppler.  9. Aortic dilatation noted. 10. 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 11. The atrial septum is grossly normal.   ECG - SR rate 61 BPM. (See cardiology reading for complete details)   PHYSICAL EXAM  Temp:  [97.6  F (36.4 C)-98.6 F (37 C)] 98.4 F (36.9 C) (10/17 1143) Pulse Rate:  [54-78] 58 (10/17 1143) Resp:  [14-21] 16 (10/17 1143) BP: (110-145)/(71-106) 121/80 (10/17 1143) SpO2:  [97 %-100 %] 97 % (10/17 1143)  General - Well nourished, well developed, in no apparent distress.  Ophthalmologic - fundi not visualized due to noncooperation.  Cardiovascular - Regular rhythm and rate.  Mental Status -  Level of arousal and orientation to time, place, and person were intact. Language including expression, naming, repetition, comprehension was assessed and found intact. Attention span and concentration were normal. Fund of Knowledge was assessed and was intact.  Cranial Nerves II - XII - II - Visual field intact OU. III, IV, VI - Extraocular movements intact. V - Facial sensation intact bilaterally. VII - Facial movement intact bilaterally. VIII - Hearing & vestibular intact bilaterally. X - Palate elevates symmetrically. XI - Chin turning & shoulder shrug intact bilaterally. XII - Tongue protrusion intact.  Motor Strength - The patient's strength was normal in all extremities and pronator drift was absent.  Bulk was normal and fasciculations were absent.   Motor Tone - Muscle tone was assessed at the neck and appendages and was normal.  Reflexes - The patient's reflexes were symmetrical in all extremities and he had no pathological reflexes.  Sensory - Light touch, temperature/pinprick were assessed and were symmetrical.    Coordination - The patient had normal movements in the hands and feet with no ataxia or dysmetria.  Tremor was absent.  Gait and Station - deferred.    ASSESSMENT/PLAN Mr. OVEL GUBA is a 63 y.o. male with history of Bell's Palsey presenting with LUE weakness / numbness. He did not receive IV t-PA due to late presentation (>4.5 hours from time of onset).  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   CT head - Minor atrophy.  No acute intracranial abnormality by noncontrast CT.   MRI head 02/08/19 - punctate acute infarct within the right temporal lobe. Multiple small chronic microhemorrhages within the left parietal lobe.   MRI Brain 02/09/19 - small shower of micro embolic infarctions in the right MCA territory.  CTA H&N - Negative for large vessel occlusion. Generalized arterial tortuosity in the head and neck, but minimal atherosclerosis and no significant arterial stenosis.  2D Echo - EF 60 - 65%. No cardiac source of emboli identified.   LE venous doppler pending  Recommend TEE and loop recorder to rule out cardiac source.  Sars Corona Virus 2 - negative  LDL - 118  HgbA1c - 5.5  VTE prophylaxis - Lovenox  No antithrombotic prior to admission, now on aspirin 81 mg daily and clopidogrel 75 mg daily. Continue DAPT for 3 week and then ASA alone.  Patient counseled to be compliant with his antithrombotic medications  Ongoing aggressive stroke risk factor management  Therapy recommendations: none  Disposition:  Home after TEE and loop  BP management  Home BP meds: none  Current BP meds: none  Stable . Permissive hypertension (OK if < 220/120) but gradually normalize in 5-7 days. . Long-term BP goal normotensive  Hyperlipidemia  Home Lipid lowering medication: none   LDL 118, goal < 70  Current lipid lowering medication: Lipitor 40 mg daily   Continue statin at discharge   Other Stroke Risk Factors  Advanced age  Obesity, Body mass index is 29.84 kg/m., recommend weight loss, diet and exercise as appropriate   TIA's both parents  Other Active Problems  Mild dilatation of the ascending aorta measuring 38 mm.   Hospital day # 1  Rosalin Hawking, MD PhD Stroke Neurology 02/09/2019 3:36 PM   To contact Stroke Continuity provider, please refer to [email or web address] After hours, contact General Neurology

## 2019-02-10 MED ORDER — SODIUM CHLORIDE 0.9 % IV SOLN
INTRAVENOUS | Status: DC
  Administered 2019-02-10: 20:00:00 via INTRAVENOUS

## 2019-02-10 NOTE — Progress Notes (Signed)
PROGRESS NOTE    Joshua Mayer  [id number] DOB: [birth year] DOA: 02/07/2019 PCP: Patient, No Pcp Per  Outpatient Specialists:     Brief Narrative: Patient is a 63 year old Caucasian male past medical history significant for Bell's palsy.  Patient was admitted with left upper extremity numbness and weakness that started on the day of presentation.  Initial MRI of the brain done on 02/08/2019 revealed acute punctate infarct within the right temporal lobe.  However, the examination was said to be motion degraded.  Multiple small chronic microhemorrhages within the left parietal lobe was also reported.  Patient had repeat MRI of the brain done today, 02/09/2019, that confirmed presence of acute punctate infection in the lateral right temporal lobe but also suggested other tiny acute infarctions along the surface of the insula and at the right temporoparietal junction.  The imaging was said to indicate small show of microembolic infarctions in the right MCA territory.  Communicated with neurology team, TEE and possible loop device is planned for Monday.  Doppler ultrasound of the lower extremities came back negative for DVT.  Patient is currently on aspirin, Plavix and atorvastatin.  Lipid profile revealed total cholesterol of 183, HDL of 45 and LDL of 118.  Further management will depend on hospital course.  02/10/2019: Patient seen.  No new complaints.  Patient is awaiting TEE and loop device placement.  Consultants cardiology team (discussed with Dr. Uvaldo Bristle)  Assessment & Plan:   Principal Problem:   TIA (transient ischemic attack)  TIA: MRI brain result is noted. For further work-up. TEE is planned. Loop recorder is also planned. Continue aspirin and Plavix for now. Continue atorvastatin. Continue to monitor blood pressure closely. Further management depend on hospital course.   DVT prophylaxis: Subcutaneous Lovenox Code Status: Full code Family Communication:  Disposition  Plan: Home eventually   Consultants:   Neurology  Cardiology for TEE and loop device placement  Procedures:   None  Antimicrobials:   None   Subjective: No new complaints. Left upper extremity weakness and numbness have resolved.  Objective: Vitals:   02/09/19 2056 02/10/19 0045 02/10/19 0457 02/10/19 0831  BP: 122/83 118/79 (!) 124/91 125/78  Pulse: (!) 55 (!) 57 60 64  Resp: 18 18 18 18   Temp: 97.8 F (36.6 C) 98.1 F (36.7 C) 97.9 F (36.6 C) 98.2 F (36.8 C)  TempSrc: Oral Oral Oral Oral  SpO2: 98% 99% 100% 99%  Weight:      Height:        Intake/Output Summary (Last 24 hours) at 02/10/2019 0854 Last data filed at 02/10/2019 [id number] Gross per 24 hour  Intake 240 ml  Output --  Net 240 ml   Filed Weights   02/08/19 1255  Weight: 99.8 kg    Examination:  General exam: Appears calm and comfortable  Respiratory system: Clear to auscultation. Respiratory effort normal. Cardiovascular system: S1 & S2 heard Gastrointestinal system: Abdomen is nondistended, soft and nontender. No organomegaly or masses felt. Normal bowel sounds heard. Central nervous system: Awake and alert.  Patient moves all extremities.   Extremities: No leg edema.  Data Reviewed: I have personally reviewed following labs and imaging studies  CBC: Recent Labs  Lab 02/07/19 1810 02/08/19 0550  WBC 8.6 9.0  HGB 14.3 14.2  HCT 43.2 43.2  MCV 98.9 98.0  PLT 219 [id number]   Basic Metabolic Panel: Recent Labs  Lab 02/07/19 1810 02/08/19 0550  NA 139 140  K 3.9 3.9  CL 104 105  CO2  28 26  GLUCOSE 102* 97  BUN 15 13  CREATININE 1.04 0.99  CALCIUM 9.1 8.9   GFR: Estimated Creatinine Clearance: 92.2 mL/min (by C-G formula based on SCr of 0.99 mg/dL). Liver Function Tests: Recent Labs  Lab 02/08/19 0550  AST 21  ALT 27  ALKPHOS 64  BILITOT 0.6  PROT 6.8  ALBUMIN 3.9   No results for input(s): LIPASE, AMYLASE in the last 168 hours. No results for input(s): AMMONIA in the  last 168 hours. Coagulation Profile: No results for input(s): INR, PROTIME in the last 168 hours. Cardiac Enzymes: No results for input(s): CKTOTAL, CKMB, CKMBINDEX, TROPONINI in the last 168 hours. BNP (last 3 results) No results for input(s): PROBNP in the last 8760 hours. HbA1C: Recent Labs    02/08/19 0550  HGBA1C 5.5   CBG: No results for input(s): GLUCAP in the last 168 hours. Lipid Profile: Recent Labs    02/08/19 0550  CHOL 183  HDL 45  LDLCALC 118*  TRIG 98  CHOLHDL 4.1   Thyroid Function Tests: No results for input(s): TSH, T4TOTAL, FREET4, T3FREE, THYROIDAB in the last 72 hours. Anemia Panel: No results for input(s): VITAMINB12, FOLATE, FERRITIN, TIBC, IRON, RETICCTPCT in the last 72 hours. Urine analysis:    Component Value Date/Time   COLORURINE YELLOW 03/22/2006 0745   LABSPEC > OR = 1.030 03/22/2006 0745   PHURINE 6.0 03/22/2006 0745   GLUCOSEU NEGATIVE 03/22/2006 0745   BILIRUBINUR NEGATIVE 03/22/2006 0745   KETONESUR NEGATIVE 03/22/2006 0745   UROBILINOGEN 0.2 mg/dL 03/22/2006 0745   NITRITE Negative 03/22/2006 0745   LEUKOCYTESUR Negative 03/22/2006 0745   Sepsis Labs: @LABRCNTIP (procalcitonin:4,lacticidven:4)  ) Recent Results (from the past 240 hour(s))  SARS CORONAVIRUS 2 (TAT 6-24 HRS) Nasopharyngeal Nasopharyngeal Swab     Status: None   Collection Time: 02/07/19 11:21 PM   Specimen: Nasopharyngeal Swab  Result Value Ref Range Status   SARS Coronavirus 2 NEGATIVE NEGATIVE Final    Comment: (NOTE) SARS-CoV-2 target nucleic acids are NOT DETECTED. The SARS-CoV-2 RNA is generally detectable in upper and lower respiratory specimens during the acute phase of infection. Negative results do not preclude SARS-CoV-2 infection, do not rule out co-infections with other pathogens, and should not be used as the sole basis for treatment or other patient management decisions. Negative results must be combined with clinical observations, patient  history, and epidemiological information. The expected result is Negative. Fact Sheet for Patients: SugarRoll.be Fact Sheet for Healthcare Providers: https://www.woods-mathews.com/ This test is not yet approved or cleared by the [place] FDA and  has been authorized for detection and/or diagnosis of SARS-CoV-2 by FDA under an Emergency Use Authorization (EUA). This EUA will remain  in effect (meaning this test can be used) for the duration of the COVID-19 declaration under Section 56 4(b)(1) of the Act, 21 U.S.C. section 360bbb-3(b)(1), unless the authorization is terminated or revoked sooner. Performed at [facility], [place] [address], [place], [place] [address]          Radiology Studies: Mr Brain Wo Contrast  Result Date: 02/09/2019 CLINICAL DATA:  Follow-up stroke. Punctate right temporal lobe infarction shown on motion degraded study yesterday. EXAM: MRI HEAD WITHOUT CONTRAST TECHNIQUE: Multiplanar, multiecho pulse sequences of the brain and surrounding structures were obtained without intravenous contrast. COMPARISON:  02/08/2019 FINDINGS: Brain: Diffusion imaging is repeated today. Today's study also suffers from some motion degradation. The study confirms the presence of a punctate acute infarction in the right lateral temporal lobe. One  could question a few other tiny punctate foci of restricted diffusion in the right middle cerebral artery territory, particularly along the surface of the insula and in the right temporoparietal junction region, that suggest that we are dealing with a small shower of micro emboli in the right middle cerebral artery territory. No large or confluent infarction. No evidence swelling, mass effect or hemorrhage. IMPRESSION: Repeat diffusion imaging confirms the presence of a punctate acute infarction in the lateral right temporal lobe and suggests other tiny acute infarctions along the surface of the  insula and at the right temporoparietal junction. This would indicate that we are dealing with a small shower of micro embolic infarctions in the right MCA territory. Electronically Signed   By: Nelson Chimes M.D.   On: 02/09/2019 11:06   Mr Brain Wo Contrast  Result Date: 02/08/2019 CLINICAL DATA:  Focal neuro deficit, greater than 6 hours, stroke suspected. Additional history provided: Patient reports left arm tingling/numbness since approximately 4 p.m. yesterday. EXAM: MRI HEAD WITHOUT CONTRAST TECHNIQUE: Multiplanar, multiecho pulse sequences of the brain and surrounding structures were obtained without intravenous contrast. COMPARISON:  CT angiogram head/neck 02/08/2019, head CT 02/07/2019 FINDINGS: Brain: Motion degraded examination. This includes motion degradation of both the axial and coronal diffusion-weighted imaging. A punctate focus of restricted diffusion is seen within the right temporal lobe on both axial and coronal diffusion-weighted imaging (series 4, image 22) (series 9, image 24). No evidence of intracranial mass. No midline shift or extra-axial fluid collection. Multiple small foci of SWI signal loss along left parietal lobe consistent with chronic microhemorrhages. Mild generalized parenchymal atrophy and chronic small vessel ischemic disease. Partially empty sella turcica Vascular: Flow voids maintained within the proximal large arterial vessels. Skull and upper cervical spine: No focal marrow lesion Sinuses/Orbits: Visualized orbits demonstrate no acute abnormality. Trace ethmoid sinus mucosal thickening. No significant mastoid effusion. IMPRESSION: 1. Motion degraded examination. 2. Punctate acute infarct within the right temporal lobe. No convincing evidence of acute infarction elsewhere within the brain on motion degraded diffusion-weighted imaging. 3. Mild generalized parenchymal atrophy and chronic small vessel ischemic disease. 4. Multiple small chronic microhemorrhages within the  left parietal lobe. Electronically Signed   By: Kellie Simmering   On: 02/08/2019 12:21   Vas [place] Lower Extremity Venous (dvt)  Result Date: 02/09/2019  Lower Venous Study Indications: Stroke.  Comparison Study: No prior study on file for comparison Performing Technologist: Sharion Dove RVS  Examination Guidelines: A complete evaluation includes B-mode imaging, spectral Doppler, color Doppler, and power Doppler as needed of all accessible portions of each vessel. Bilateral testing is considered an integral part of a complete examination. Limited examinations for reoccurring indications may be performed as noted.  +---------+---------------+---------+-----------+----------+--------------+  RIGHT     Compressibility Phasicity Spontaneity Properties Thrombus Aging  +---------+---------------+---------+-----------+----------+--------------+  CFV       Full            Yes       Yes                                    +---------+---------------+---------+-----------+----------+--------------+  SFJ       Full                                                             +---------+---------------+---------+-----------+----------+--------------+  FV Prox   Full                                                             +---------+---------------+---------+-----------+----------+--------------+  FV Mid    Full                                                             +---------+---------------+---------+-----------+----------+--------------+  FV Distal Full                                                             +---------+---------------+---------+-----------+----------+--------------+  PFV       Full                                                             +---------+---------------+---------+-----------+----------+--------------+  POP       Full            Yes       Yes                                    +---------+---------------+---------+-----------+----------+--------------+  PTV       Full                                                              +---------+---------------+---------+-----------+----------+--------------+  PERO      Full                                                             +---------+---------------+---------+-----------+----------+--------------+   +---------+---------------+---------+-----------+----------+--------------+  LEFT      Compressibility Phasicity Spontaneity Properties Thrombus Aging  +---------+---------------+---------+-----------+----------+--------------+  CFV       Full            Yes       Yes                                    +---------+---------------+---------+-----------+----------+--------------+  SFJ       Full                                                             +---------+---------------+---------+-----------+----------+--------------+  FV Prox   Full                                                             +---------+---------------+---------+-----------+----------+--------------+  FV Mid    Full                                                             +---------+---------------+---------+-----------+----------+--------------+  FV Distal Full                                                             +---------+---------------+---------+-----------+----------+--------------+  PFV       Full                                                             +---------+---------------+---------+-----------+----------+--------------+  POP       Full            Yes       Yes                                    +---------+---------------+---------+-----------+----------+--------------+  PTV       Full                                                             +---------+---------------+---------+-----------+----------+--------------+  PERO      Full                                                             +---------+---------------+---------+-----------+----------+--------------+     Summary: Right: There is no evidence of deep vein thrombosis in the lower  extremity. Left: There is no evidence of deep vein thrombosis in the lower extremity.  *See table(s) above for measurements and observations.    Preliminary         Scheduled Meds:  aspirin EC  81 mg Oral Daily   atorvastatin  40 mg Oral q1800   clopidogrel  75 mg Oral Daily   enoxaparin (LOVENOX) injection  40 mg Subcutaneous Daily   LORazepam  1 mg Intravenous Once   Continuous Infusions:   LOS: 2 days    Time spent: 25 minutes.    Dana Allan, MD  [facility] Pager #: [phone] 7PM-7AM contact  night coverage as above

## 2019-02-10 NOTE — Progress Notes (Signed)
    [facility] has been requested to perform a transesophageal echocardiogram on Joshua Mayer for stroke.  After careful review of history and examination, the risks and benefits of transesophageal echocardiogram have been explained including risks of esophageal damage, perforation (1:10,000 risk), bleeding, pharyngeal hematoma as well as other potential complications associated with conscious sedation including aspiration, arrhythmia, respiratory failure and death. Alternatives to treatment were discussed, questions were answered. Patient is willing to proceed.   Hopefully pt will be scheduled for Monday 02/11/19. This will be determined in the morning. I will place orders and make NPO after midnight.   Daune Perch, NP  02/10/2019 12:45 PM

## 2019-02-11 ENCOUNTER — Inpatient Hospital Stay (HOSPITAL_COMMUNITY): Payer: BC Managed Care – PPO

## 2019-02-11 ENCOUNTER — Encounter (HOSPITAL_COMMUNITY)
Admission: EM | Disposition: A | Discharge status: Home / Self Care | Payer: Self-pay | Source: Home / Self Care | Attending: Internal Medicine

## 2019-02-11 ENCOUNTER — Encounter (HOSPITAL_COMMUNITY): Payer: Self-pay

## 2019-02-11 DIAGNOSIS — E785 Hyperlipidemia, unspecified: Secondary | ICD-10-CM | POA: Diagnosis not present

## 2019-02-11 DIAGNOSIS — I6389 Other cerebral infarction: Secondary | ICD-10-CM

## 2019-02-11 DIAGNOSIS — R531 Weakness: Secondary | ICD-10-CM | POA: Diagnosis not present

## 2019-02-11 DIAGNOSIS — I6381 Other cerebral infarction due to occlusion or stenosis of small artery: Secondary | ICD-10-CM | POA: Diagnosis not present

## 2019-02-11 DIAGNOSIS — I639 Cerebral infarction, unspecified: Secondary | ICD-10-CM | POA: Diagnosis present

## 2019-02-11 HISTORY — PX: TEE WITHOUT CARDIOVERSION: SHX5443

## 2019-02-11 HISTORY — PX: BUBBLE STUDY: SHX6837

## 2019-02-11 HISTORY — PX: LOOP RECORDER INSERTION: EP1214

## 2019-02-11 SURGERY — LOOP RECORDER INSERTION

## 2019-02-11 SURGERY — ECHOCARDIOGRAM, TRANSESOPHAGEAL
Anesthesia: Moderate Sedation

## 2019-02-11 MED ORDER — BUTAMBEN-TETRACAINE-BENZOCAINE 2-2-14 % EX AERO
INHALATION_SPRAY | CUTANEOUS | Status: DC | PRN
  Administered 2019-02-11: 1 via TOPICAL

## 2019-02-11 MED ORDER — MIDAZOLAM HCL (PF) 10 MG/2ML IJ SOLN
INTRAMUSCULAR | Status: DC | PRN
  Administered 2019-02-11: 1 mg via INTRAVENOUS
  Administered 2019-02-11 (×2): 2 mg via INTRAVENOUS

## 2019-02-11 MED ORDER — DIPHENHYDRAMINE HCL 50 MG/ML IJ SOLN
INTRAMUSCULAR | Status: AC
  Filled 2019-02-11: qty 1

## 2019-02-11 MED ORDER — LIDOCAINE-EPINEPHRINE 1 %-1:100000 IJ SOLN
INTRAMUSCULAR | Status: AC
  Filled 2019-02-11: qty 1

## 2019-02-11 MED ORDER — FENTANYL CITRATE (PF) 100 MCG/2ML IJ SOLN
INTRAMUSCULAR | Status: AC
  Filled 2019-02-11: qty 4

## 2019-02-11 MED ORDER — MIDAZOLAM HCL (PF) 5 MG/ML IJ SOLN
INTRAMUSCULAR | Status: AC
  Filled 2019-02-11: qty 2

## 2019-02-11 MED ORDER — FENTANYL CITRATE (PF) 100 MCG/2ML IJ SOLN
INTRAMUSCULAR | Status: DC | PRN
  Administered 2019-02-11 (×4): 25 ug via INTRAVENOUS

## 2019-02-11 MED ORDER — LIDOCAINE-EPINEPHRINE 1 %-1:100000 IJ SOLN
INTRAMUSCULAR | Status: DC | PRN
  Administered 2019-02-11: 30 mL

## 2019-02-11 MED ORDER — CLOPIDOGREL BISULFATE 75 MG PO TABS: 75.0000 mg | ORAL_TABLET | Freq: Every day | ORAL | 0 refills | Status: DC

## 2019-02-11 MED ORDER — ATORVASTATIN CALCIUM 40 MG PO TABS: 40.0000 mg | ORAL_TABLET | Freq: Every day | ORAL | 2 refills | Status: DC

## 2019-02-11 MED ORDER — ASPIRIN 81 MG PO TBEC: 81.0000 mg | DELAYED_RELEASE_TABLET | Freq: Every day | ORAL | 3 refills | Status: DC

## 2019-02-11 SURGICAL SUPPLY — 2 items
MONITOR REVEAL LINQ II (Prosthesis & Implant Heart) ×2 IMPLANT
PACK LOOP INSERTION (CUSTOM PROCEDURE TRAY) ×3 IMPLANT

## 2019-02-11 NOTE — CV Procedure (Signed)
   Transesophageal Echocardiogram  Indications: Stroke  Time out performed  During this procedure the patient is administered a total of Versed 5 mg and Fentanyl 100 mcg to achieve and maintain moderate conscious sedation.  The patient's heart rate, blood pressure, and oxygen saturation are monitored continuously during the procedure. The period of conscious sedation is 20 minutes, of which I was present face-to-face 100% of this time.  Findings:  Left Ventricle: Normal EF  Mitral Valve: Trace MR  Aortic Valve: Trace AI  Tricuspid Valve: Trace TR  Left Atrium: negative thrombus  Bubble Contrast Study: negative, no PFO  Candee Furbish, MD

## 2019-02-11 NOTE — Progress Notes (Signed)
  Echocardiogram Echocardiogram Transesophageal has been performed.  Joshua Mayer 02/11/2019, 4:47 PM

## 2019-02-11 NOTE — Progress Notes (Signed)
STROKE TEAM PROGRESS NOTE   INTERVAL HISTORY Pt wife at bedside. Pt lying in bed, no complains. No acute event overnight. TEE unremarkable and loop placed. Discussed with pt about [place] trial and pt is interested.    OBJECTIVE Vitals:   02/10/19 1946 02/10/19 2338 02/11/19 0438 02/11/19 0800  BP: (!) 135/94 122/68 110/79 121/81  Pulse: 64 61 (!) 57 60  Resp: 18 18 18 18   Temp: 98.3 F (36.8 C) 98.2 F (36.8 C) 98.3 F (36.8 C) 98.4 F (36.9 C)  TempSrc: Oral Oral Oral Oral  SpO2: 99% 97% 94% 98%  Weight:      Height:        CBC:  Recent Labs  Lab 02/07/19 1810 02/08/19 0550  WBC 8.6 9.0  HGB 14.3 14.2  HCT 43.2 43.2  MCV 98.9 98.0  PLT 219 [id number]    Basic Metabolic Panel:  Recent Labs  Lab 02/07/19 1810 02/08/19 0550  NA 139 140  K 3.9 3.9  CL 104 105  CO2 28 26  GLUCOSE 102* 97  BUN 15 13  CREATININE 1.04 0.99  CALCIUM 9.1 8.9    Lipid Panel:     Component Value Date/Time   CHOL 183 02/08/2019 0550   TRIG 98 02/08/2019 0550   TRIG 92 03/22/2006 0745   HDL 45 02/08/2019 0550   CHOLHDL 4.1 02/08/2019 0550   VLDL 20 02/08/2019 0550   LDLCALC 118 (H) 02/08/2019 0550   HgbA1c:  Lab Results  Component Value Date   HGBA1C 5.5 02/08/2019   Urine Drug Screen: No results found for: LABOPIA, COCAINSCRNUR, LABBENZ, AMPHETMU, THCU, LABBARB  Alcohol Level No results found for: ETH  IMAGING  Ct Angio Head W Or Wo Contrast Ct Angio Neck W Or Wo Contrast 02/08/2019 IMPRESSION:  1. Negative for large vessel occlusion.  2. Generalized arterial tortuosity in the head and neck, but minimal atherosclerosis and no significant arterial stenosis.  3. CT appearance of the brain is stable since yesterday.   Dg Chest 2 View 02/07/2019 IMPRESSION:  No acute chest process.   Ct Head Wo Contrast 02/07/2019 IMPRESSION:  Minor atrophy.  No acute intracranial abnormality by noncontrast CT.   Mr Brain Wo Contrast 02/08/2019 IMPRESSION:  1. Motion degraded  examination.  2. Punctate acute infarct within the right temporal lobe. No convincing evidence of acute infarction elsewhere within the brain on motion degraded diffusion-weighted imaging.  3. Mild generalized parenchymal atrophy and chronic small vessel ischemic disease.  4. Multiple small chronic microhemorrhages within the left parietal lobe.   MRI Brain WO Contrast (repeat) 02/09/2019 IMPRESSION: Repeat diffusion imaging confirms the presence of a punctate acute infarction in the lateral right temporal lobe and suggests other tiny acute infarctions along the surface of the insula and at the right temporoparietal junction. This would indicate that we are dealing with a small shower of micro embolic infarctions in the right MCA territory.  Transthoracic Echocardiogram  02/08/2019 IMPRESSIONS  1. Left ventricular ejection fraction, by visual estimation, is 60 to 65%. The left ventricle has normal function. There is no left ventricular hypertrophy.  2. Global right ventricle has normal systolic function.The right ventricular size is normal. No increase in right ventricular wall thickness.  3. Left atrial size was normal.  4. Right atrial size was normal.  5. The mitral valve is normal in structure. Trace mitral valve regurgitation.  6. The tricuspid valve is normal in structure. Tricuspid valve regurgitation is trivial.  7. The aortic valve is normal  in structure. Aortic valve regurgitation was not visualized by color flow Doppler.  8. The pulmonic valve was normal in structure. Pulmonic valve regurgitation is not visualized by color flow Doppler.  9. Aortic dilatation noted. 10. 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 11. The atrial septum is grossly normal.  TEE  Left Ventricle: Normal EF Mitral Valve: Trace MR Aortic Valve: Trace AI Tricuspid Valve: Trace TR Left Atrium: negative thrombus Bubble Contrast Study: negative, no PFO  ECG - SR rate 61 BPM.  (See cardiology reading for complete details)   PHYSICAL EXAM    Temp:  [98.2 F (36.8 C)-98.7 F (37.1 C)] 98.4 F (36.9 C) (10/19 0800) Pulse Rate:  [57-64] 60 (10/19 0800) Resp:  [18] 18 (10/19 0800) BP: (110-135)/(68-94) 121/81 (10/19 0800) SpO2:  [94 %-99 %] 98 % (10/19 0800)  General - Well nourished, well developed, in no apparent distress.  Ophthalmologic - fundi not visualized due to noncooperation.  Cardiovascular - Regular rhythm and rate.  Mental Status -  Level of arousal and orientation to time, place, and person were intact. Language including expression, naming, repetition, comprehension was assessed and found intact. Attention span and concentration were normal. Fund of Knowledge was assessed and was intact.  Cranial Nerves II - XII - II - Visual field intact OU. III, IV, VI - Extraocular movements intact. V - Facial sensation intact bilaterally. VII - Facial movement intact bilaterally. VIII - Hearing & vestibular intact bilaterally. X - Palate elevates symmetrically. XI - Chin turning & shoulder shrug intact bilaterally. XII - Tongue protrusion intact.  Motor Strength - The patient's strength was normal in all extremities and pronator drift was absent.  Bulk was normal and fasciculations were absent.   Motor Tone - Muscle tone was assessed at the neck and appendages and was normal.  Reflexes - The patient's reflexes were symmetrical in all extremities and he had no pathological reflexes.  Sensory - Light touch, temperature/pinprick were assessed and were symmetrical.    Coordination - The patient had normal movements in the hands and feet with no ataxia or dysmetria.  Tremor was absent.  Gait and Station - deferred.    ASSESSMENT/PLAN [name] is a 63 y.o. male with history of Bell's Palsey presenting with LUE weakness / numbness. He did not receive IV t-PA due to late presentation (>4.5 hours from time of onset).  Stroke: small  shower of micro embolic infarctions in the right MCA territory - embolic - unknown source.   CT head - Minor atrophy.  No acute intracranial abnormality by noncontrast CT.   MRI head 02/08/19 - punctate acute infarct within the right temporal lobe. Multiple small chronic microhemorrhages within the left parietal lobe.   MRI Brain 02/09/19 - small shower of micro embolic infarctions in the right MCA territory.  CTA H&N - Negative for large vessel occlusion. Generalized arterial tortuosity in the head and neck, but minimal atherosclerosis and no significant arterial stenosis.  2D Echo - EF 60 - 65%. No cardiac source of emboli identified.   LE venous doppler no DVT   TEE unremarkable, no PFO   loop recorder placed 10/19 to rule out cardiac source    [employer] Virus 2 - negative  LDL - 118  HgbA1c - 5.5  VTE prophylaxis - Lovenox  No antithrombotic prior to admission, now on aspirin 81 mg daily and clopidogrel 75 mg daily. Continue DAPT for 3 week and then ASA alone.  Therapy recommendations: none Disposition:  Home after TEE and loop Follow-up Stroke Clinic at [facility] in 4 weeks. Office will call with appointment date and time. Order placed.  BP management  Home BP meds: none  Current BP meds: none  Stable . Permissive hypertension (OK if < 220/120) but gradually normalize in 5-7 days. . Long-term BP goal normotensive  Hyperlipidemia  Home Lipid lowering medication: none   LDL 118, goal < 70  Current lipid lowering medication: Lipitor 40 mg daily   Continue statin at discharge  Other Stroke Risk Factors  Advanced age  Obesity, Body mass index is 29.84 kg/m., recommend weight loss, diet and exercise as appropriate   TIA's both parents  Other Active Problems  Mild dilatation of the ascending aorta measuring 38 mm.  Pt interested in [place] trial and will be getting consent   Hospital day # 3  Neurology will sign off. Please  call with questions. Pt will follow up with stroke clinic NP at [facility] in about 4 weeks. Thanks for the consult.   Rosalin Hawking, MD PhD Stroke Neurology 02/11/2019 11:33 AM   To contact Stroke Continuity provider, please refer to [email or web address] After hours, contact General Neurology

## 2019-02-11 NOTE — TOC Initial Note (Signed)
Transition of Care [facility]) - Initial/Assessment Note    Patient Details  Name: Joshua Mayer MRN: [id number] Date of Birth: [birth year]  Transition [facility]) CM/SW Contact:    Pollie Friar, RN Phone Number: 02/11/2019, 4:27 PM  Clinical Narrative:                 Pt with no f/u per PT/OT and no DME needs.  Pt without a PCP. CM was able to get him an appointment and placed the information on his AVS. Pt has support at home and transportation to home.  Expected Discharge Plan: Home/Self Care Barriers to Discharge: Continued Medical Work up   Patient Goals and CMS Choice        Expected Discharge Plan and Services Expected Discharge Plan: Home/Self Care   Discharge Planning Services: CM Consult   Living arrangements for the past 2 months: Single Family Home                                      Prior Living Arrangements/Services Living arrangements for the past 2 months: Single Family Home Lives with:: Spouse Patient language and need for interpreter reviewed:: Yes(no needs) Do you feel safe going back to the place where you live?: Yes      Need for Family Participation in Patient Care: No (Comment) Care giver support system in place?: Yes (comment)(spouse)   Criminal Activity/Legal Involvement Pertinent to Current Situation/Hospitalization: No - Comment as needed  Activities of Daily Living Home Assistive Devices/Equipment: Eyeglasses ADL Screening (condition at time of admission) Patient's cognitive ability adequate to safely complete daily activities?: Yes Is the patient deaf or have difficulty hearing?: No Does the patient have difficulty seeing, even when wearing glasses/contacts?: No Does the patient have difficulty concentrating, remembering, or making decisions?: No Patient able to express need for assistance with ADLs?: Yes Does the patient have difficulty dressing or bathing?: No Independently performs ADLs?: Yes (appropriate for  developmental age) Does the patient have difficulty walking or climbing stairs?: No Weakness of Legs: None Weakness of Arms/Hands: None  Permission Sought/Granted                  Emotional Assessment Appearance:: Appears stated age Attitude/Demeanor/Rapport: Engaged Affect (typically observed): Accepting, Pleasant Orientation: : Oriented to Self, Oriented to Place, Oriented to  Time, Oriented to Situation   Psych Involvement: No (comment)  Admission diagnosis:  TIA (transient ischemic attack) [G45.9] Patient Active Problem List   Diagnosis Date Noted  . TIA (transient ischemic attack) 02/07/2019  . NEPHROLITHIASIS, HX OF 08/14/2007   PCP:  Patient, No Pcp Per Pharmacy:   [facility], [place] [address] Phone: [phone] Fax: [phone]     Social Determinants of Health (SDOH) Interventions    Readmission Risk Interventions No flowsheet data found.

## 2019-02-11 NOTE — Progress Notes (Signed)
PROGRESS NOTE    Joshua Mayer  [id number] DOB: Feb 08, 1956 DOA: 02/07/2019 PCP: Patient, No Pcp Per   Brief Narrative: Patient is a 63 year old Caucasian male past medical history significant for Bell's palsy was admitted to the hospital with left upper extremity numbness and weakness that started on the day of presentation.  Initial MRI of the brain done on 02/08/2019 revealed acute punctate infarct within the right temporal lobe.  However, the examination was said to be motion degraded.  Multiple small chronic microhemorrhages within the left parietal lobe was also reported.  Patient had repeat MRI of the brain done on10/17/2020, that confirmed presence of acute punctate infection in the lateral right temporal lobe but also suggested other tiny acute infarctions along the surface of the insula and at the right temporoparietal junction.  The imaging was said to indicate small show of microembolic infarctions in the right MCA territory.  Patient was seen by neurology team, TEE and possible loop device is planned for Monday.  Doppler ultrasound of the lower extremities came back negative for DVT.  Patient is currently on aspirin, Plavix and atorvastatin.  Lipid profile revealed total cholesterol of 183, HDL of 45 and LDL of 118.  Further management will depend on hospital course.  Assessment & Plan:   Cryptogenic stroke. MRI brain result is noted. TEE and Loop recorder is planned today. Continue aspirin and Plavix, atorvastatin. Continue to monitor blood pressure closely.  Lower extremity Doppler was negative for DVT.    DVT prophylaxis: Subcutaneous Lovenox  Code Status: Full code  Family Communication:   None  Disposition Plan: Home eventually likely by tomorrow.  Awaiting for procedure.   Consultants:   Neurology  Cardiology for TEE and loop device placement  Procedures:   None  Antimicrobials:   None   Subjective: Patient denies any interval complaints today.  Denies  any chest pain, shortness of breath, fever chills.  Awaiting for loop recorder and echocardiogram.  Objective: Vitals:   02/10/19 2338 02/11/19 0438 02/11/19 0800 02/11/19 1300  BP: 122/68 110/79 121/81 126/80  Pulse: 61 (!) 57 60 (!) 57  Resp: 18 18 18    Temp: 98.2 F (36.8 C) 98.3 F (36.8 C) 98.4 F (36.9 C)   TempSrc: Oral Oral Oral   SpO2: 97% 94% 98% 98%  Weight:      Height:        Intake/Output Summary (Last 24 hours) at 02/11/2019 1409 Last data filed at 02/11/2019 [id number] Gross per 24 hour  Intake 247.16 ml  Output 795 ml  Net -547.84 ml   Filed Weights   02/08/19 1255  Weight: 99.8 kg    Examination: General:  Average built, not in obvious distress HENT: Normocephalic, pupils equally reacting to light and accommodation.  No scleral pallor or icterus noted. Oral mucosa is moist.  Chest:  Clear breath sounds.  Diminished breath sounds bilaterally. No crackles or wheezes.  CVS: S1 &S2 heard. No murmur.  Regular rate and rhythm. Abdomen: Soft, nontender, nondistended.  Bowel sounds are heard.  Liver is not palpable, no abdominal mass palpated Extremities: No cyanosis, clubbing or edema.  Peripheral pulses are palpable. Psych: Alert, awake and oriented, normal mood CNS:  No cranial nerve deficits.  Power equal in all extremities.  No sensory deficits noted.  No cerebellar signs.   Skin: Warm and dry.  No rashes noted.  Data Reviewed: I have personally reviewed following labs and imaging studies  CBC: Recent Labs  Lab 02/07/19 1810 02/08/19 0550  WBC 8.6 9.0  HGB 14.3 14.2  HCT 43.2 43.2  MCV 98.9 98.0  PLT 219 [id number]   Basic Metabolic Panel: Recent Labs  Lab 02/07/19 1810 02/08/19 0550  NA 139 140  K 3.9 3.9  CL 104 105  CO2 28 26  GLUCOSE 102* 97  BUN 15 13  CREATININE 1.04 0.99  CALCIUM 9.1 8.9   GFR: Estimated Creatinine Clearance: 92.2 mL/min (by C-G formula based on SCr of 0.99 mg/dL). Liver Function Tests: Recent Labs  Lab 02/08/19 0550    AST 21  ALT 27  ALKPHOS 64  BILITOT 0.6  PROT 6.8  ALBUMIN 3.9   No results for input(s): LIPASE, AMYLASE in the last 168 hours. No results for input(s): AMMONIA in the last 168 hours. Coagulation Profile: No results for input(s): INR, PROTIME in the last 168 hours. Cardiac Enzymes: No results for input(s): CKTOTAL, CKMB, CKMBINDEX, TROPONINI in the last 168 hours. BNP (last 3 results) No results for input(s): PROBNP in the last 8760 hours. HbA1C: No results for input(s): HGBA1C in the last 72 hours. CBG: No results for input(s): GLUCAP in the last 168 hours. Lipid Profile: No results for input(s): CHOL, HDL, LDLCALC, TRIG, CHOLHDL, LDLDIRECT in the last 72 hours. Thyroid Function Tests: No results for input(s): TSH, T4TOTAL, FREET4, T3FREE, THYROIDAB in the last 72 hours. Anemia Panel: No results for input(s): VITAMINB12, FOLATE, FERRITIN, TIBC, IRON, RETICCTPCT in the last 72 hours. Urine analysis:    Component Value Date/Time   COLORURINE YELLOW 03/22/2006 0745   LABSPEC > OR = 1.030 03/22/2006 0745   PHURINE 6.0 03/22/2006 0745   GLUCOSEU NEGATIVE 03/22/2006 0745   BILIRUBINUR NEGATIVE 03/22/2006 0745   KETONESUR NEGATIVE 03/22/2006 0745   UROBILINOGEN 0.2 mg/dL 03/22/2006 0745   NITRITE Negative 03/22/2006 0745   LEUKOCYTESUR Negative 03/22/2006 0745   Sepsis Labs: @LABRCNTIP (procalcitonin:4,lacticidven:4)  ) Recent Results (from the past 240 hour(s))  SARS CORONAVIRUS 2 (TAT 6-24 HRS) Nasopharyngeal Nasopharyngeal Swab     Status: None   Collection Time: 02/07/19 11:21 PM   Specimen: Nasopharyngeal Swab  Result Value Ref Range Status   SARS Coronavirus 2 NEGATIVE NEGATIVE Final    Comment: (NOTE) SARS-CoV-2 target nucleic acids are NOT DETECTED. The SARS-CoV-2 RNA is generally detectable in upper and lower respiratory specimens during the acute phase of infection. Negative results do not preclude SARS-CoV-2 infection, do not rule out co-infections with  other pathogens, and should not be used as the sole basis for treatment or other patient management decisions. Negative results must be combined with clinical observations, patient history, and epidemiological information. The expected result is Negative. Fact Sheet for Patients: SugarRoll.be Fact Sheet for Healthcare Providers: https://www.woods-mathews.com/ This test is not yet approved or cleared by the [place] FDA and  has been authorized for detection and/or diagnosis of SARS-CoV-2 by FDA under an Emergency Use Authorization (EUA). This EUA will remain  in effect (meaning this test can be used) for the duration of the COVID-19 declaration under Section 56 4(b)(1) of the Act, 21 U.S.C. section 360bbb-3(b)(1), unless the authorization is terminated or revoked sooner. Performed at [facility], [place] [address] [place], [place] [address]          Radiology Studies: Vas [place] Lower Extremity Venous (dvt)  Result Date: 02/10/2019  Lower Venous Study Indications: Stroke.  Comparison Study: No prior study on file for comparison Performing Technologist: Sharion Dove RVS  Examination Guidelines: A complete evaluation includes B-mode imaging, spectral Doppler, color Doppler,  and power Doppler as needed of all accessible portions of each vessel. Bilateral testing is considered an integral part of a complete examination. Limited examinations for reoccurring indications may be performed as noted.  +---------+---------------+---------+-----------+----------+--------------+  RIGHT     Compressibility Phasicity Spontaneity Properties Thrombus Aging  +---------+---------------+---------+-----------+----------+--------------+  CFV       Full            Yes       Yes                                    +---------+---------------+---------+-----------+----------+--------------+  SFJ       Full                                                              +---------+---------------+---------+-----------+----------+--------------+  FV Prox   Full                                                             +---------+---------------+---------+-----------+----------+--------------+  FV Mid    Full                                                             +---------+---------------+---------+-----------+----------+--------------+  FV Distal Full                                                             +---------+---------------+---------+-----------+----------+--------------+  PFV       Full                                                             +---------+---------------+---------+-----------+----------+--------------+  POP       Full            Yes       Yes                                    +---------+---------------+---------+-----------+----------+--------------+  PTV       Full                                                             +---------+---------------+---------+-----------+----------+--------------+  PERO      Full                                                             +---------+---------------+---------+-----------+----------+--------------+   +---------+---------------+---------+-----------+----------+--------------+  LEFT      Compressibility Phasicity Spontaneity Properties Thrombus Aging  +---------+---------------+---------+-----------+----------+--------------+  CFV       Full            Yes       Yes                                    +---------+---------------+---------+-----------+----------+--------------+  SFJ       Full                                                             +---------+---------------+---------+-----------+----------+--------------+  FV Prox   Full                                                             +---------+---------------+---------+-----------+----------+--------------+  FV Mid    Full                                                              +---------+---------------+---------+-----------+----------+--------------+  FV Distal Full                                                             +---------+---------------+---------+-----------+----------+--------------+  PFV       Full                                                             +---------+---------------+---------+-----------+----------+--------------+  POP       Full            Yes       Yes                                    +---------+---------------+---------+-----------+----------+--------------+  PTV       Full                                                             +---------+---------------+---------+-----------+----------+--------------+  PERO      Full                                                             +---------+---------------+---------+-----------+----------+--------------+  Summary: Right: There is no evidence of deep vein thrombosis in the lower extremity. Left: There is no evidence of deep vein thrombosis in the lower extremity.  *See table(s) above for measurements and observations. Electronically signed by Harold Barban MD on 02/10/2019 at 4:36:05 PM.    Final        Scheduled Meds:  aspirin EC  81 mg Oral Daily   atorvastatin  40 mg Oral q1800   clopidogrel  75 mg Oral Daily   enoxaparin (LOVENOX) injection  40 mg Subcutaneous Daily   LORazepam  1 mg Intravenous Once   Continuous Infusions:  sodium chloride 20 mL/hr at 02/11/19 0300     LOS: 3 days   Oscar La, MD [facility]

## 2019-02-11 NOTE — Discharge Summary (Signed)
Physician Discharge Summary  Joshua Mayer [id number] DOB: 04/16/1956 DOA: 02/07/2019  PCP: Patient, No Pcp Per  Admit date: 02/07/2019 Discharge date: 02/11/2019  Admitted From: Home  Discharge disposition: Home   Recommendations for Outpatient Follow-Up:    Follow up with [facility] neurology in 4 weeks.  Discharge Diagnosis:   Active Problems:   Cryptogenic stroke [facility])   Discharge Condition: Improved.  Diet recommendation: Low sodium, heart healthy.   Wound care: None.  Code status: Full.   History of Present Illness:   Patient is a 63 year old Caucasian male past medical history significant for Bell's palsy was admitted to the hospital with left upper extremity numbness and weakness that started on the day of presentation.  Initial MRI of the brain done on 02/08/2019 revealed acute punctate infarct within the right temporal lobe.  However, the examination was said to be motion degraded.  Multiple small chronic microhemorrhages within the left parietal lobe was also reported.  Patient had repeat MRI of the brain done on10/17/2020, that confirmed presence of acute punctate infection in the lateral right temporal lobe but also suggested other tiny acute infarctions along the surface of the insula and at the right temporoparietal junction.  The imaging was said to indicate small show of microembolic infarctions in the right MCA territory.  Patient was seen by neurology team, TEE and possible loop device is planned for Monday.  Doppler ultrasound of the lower extremities came back negative for DVT.  Patient is currently on aspirin, Plavix and atorvastatin.  Lipid profile revealed total cholesterol of 183, HDL of 45 and LDL of 118.  Further management will depend on hospital course.   Hospital Course:  Following conditions were addressed during hospitalization,  Cryptogenic stroke. MRI brain report showed evidence of punctuate infarct multiple other infarcts.  Patient  was seen by neurology and cardiology in subsequently underwent TEE and Loop recorder is on 02/11/2019.  Continue aspirin and Plavix, atorvastatin. Lower extremity Doppler was negative for DVT.  Patient will continue aspirin and Plavix for 21 days followed by aspirin alone.  He will follow up with cardiology and neurology as outpatient.  Disposition.  At this time patient is stable for disposition home.  Patient will follow-up with [facility] neurology in 4 weeks, cardiology for loop recorder wound check up and PCP.    Medical Consultants:    Neurology  cardiology   Subjective:    Patient denies any interval complaints today.  Denies any chest pain, shortness of breath, fever chills.  Denies any focal weakness blurry vision, dizziness or lightheadedness.  Discharge Exam:   Vitals:   02/11/19 1637 02/11/19 1652  BP: (!) 143/83 (!) 141/85  Pulse:  65  Resp: 12 14  Temp: 97.7 F (36.5 C)   SpO2:  100%   Vitals:   02/11/19 1625 02/11/19 1631 02/11/19 1637 02/11/19 1652  BP: (!) 170/100 (!) 138/92 (!) 143/83 (!) 141/85  Pulse: 71 (!) 58  65  Resp: 15 13 12 14   Temp:   97.7 F (36.5 C)   TempSrc:   Temporal   SpO2: 100% 97%  100%  Weight:      Height:        General exam: Appears calm and comfortable ,Not in distress HEENT:PERRL,Oral mucosa moist Respiratory system: Bilateral equal air entry, normal vesicular breath sounds, no wheezes or crackles  Cardiovascular system: S1 & S2 heard, RRR.  Gastrointestinal system: Abdomen is nondistended, soft and nontender. No organomegaly or masses felt. Normal bowel sounds  heard. Central nervous system: Alert and oriented. No focal neurological deficits. Extremities: No edema, no clubbing ,no cyanosis, distal peripheral pulses palpable. Skin: No rashes, lesions or ulcers,no icterus ,no pallor MSK: Normal muscle bulk,tone ,power    Procedures:    Loop recorder 10/19 TEE 10/19  The results of significant diagnostics from this  hospitalization (including imaging, microbiology, ancillary and laboratory) are listed below for reference.     Diagnostic Studies:   Ct Angio Head W Or Wo Contrast  Result Date: 02/08/2019 CLINICAL DATA:  63 year old male with left arm numbness and tingling since 1400 hours yesterday. EXAM: CT ANGIOGRAPHY HEAD AND NECK TECHNIQUE: Multidetector CT imaging of the head and neck was performed using the standard protocol during bolus administration of intravenous contrast. Multiplanar CT image reconstructions and MIPs were obtained to evaluate the vascular anatomy. Carotid stenosis measurements (when applicable) are obtained utilizing NASCET criteria, using the distal internal carotid diameter as the denominator. CONTRAST:  168mL OMNIPAQUE IOHEXOL 350 MG/ML SOLN COMPARISON:  Head CT 02/07/2019. FINDINGS: CT HEAD Brain: Stable gray-white matter differentiation throughout the brain. No midline shift, ventriculomegaly, mass effect, evidence of mass lesion, intracranial hemorrhage or evidence of cortically based acute infarction. Calvarium and skull base: No acute osseous abnormality identified. Paranasal sinuses: Visualized paranasal sinuses and mastoids are stable and well pneumatized. Orbits: Visualized orbits and scalp soft tissues are within normal limits. CTA NECK Skeleton: No acute osseous abnormality identified. Congenital incomplete ossification of the posterior C1 ring, normal variant. Upper chest: Negative. Other neck: Negative. Aortic arch: 3 vessel arch configuration. No arch atherosclerosis. Right carotid system: Mildly tortuous brachiocephalic artery and proximal right CCA without plaque or stenosis. Negative right carotid bifurcation and cervical right ICA. Left carotid system: Mildly tortuous proximal left CCA. Negative left carotid bifurcation and cervical left ICA. Vertebral arteries: Negative proximal right subclavian artery. Normal right vertebral artery origin on series 10, image 587. The right  vertebral artery is mildly non dominant and patent to the skull base without stenosis. Mild proximal left subclavian artery plaque without stenosis. Normal left vertebral artery origin. Dominant left vertebral artery with mildly tortuous V1 segment which is partially obscured by dense paravertebral contrast. Otherwise the left vertebral artery is patent to the skull base without stenosis. CTA HEAD Posterior circulation: No distal vertebral stenosis, the left is dominant. Normal right PICA origin. Normal left PICA. Patent vertebrobasilar junction. Patent basilar artery is tortuous without stenosis. Normal SCA and left PCA origins. Fetal type right PCA origin. Left posterior communicating artery is diminutive or absent. Left PCA branches are within normal limits. There is mild irregularity and stenosis of the right PCA proximal P2 segment on series 14, image 53. Anterior circulation: Both ICA siphons are patent with no plaque or stenosis. Normal right posterior communicating artery origin. Patent carotid termini. Normal MCA and ACA origins. Tortuous A1 segments. Similar ectatic A2 segments. ACA branches otherwise within normal limits. Left MCA M1 segment and bifurcation are patent with tortuosity but no stenosis. Left MCA branches are within normal limits. Right MCA M1 and right MCA bifurcation are patent without stenosis. Right MCA branches are within normal limits. Venous sinuses: Patent. Anatomic variants: Dominant left vertebral artery. Fetal type right PCA origin. Review of the MIP images confirms the above findings IMPRESSION: 1. Negative for large vessel occlusion. 2. Generalized arterial tortuosity in the head and neck, but minimal atherosclerosis and no significant arterial stenosis. 3. CT appearance of the brain is stable since yesterday. Electronically Signed   By: Lemmie Evens  [place] Crane M.D.   On: 02/08/2019 02:56   Dg Chest 2 View  Result Date: 02/07/2019 CLINICAL DATA:  Chest pain EXAM: CHEST - 2 VIEW COMPARISON:   None. FINDINGS: Normal heart size and vascularity. No acute airspace process, pneumonia, collapse or consolidation. Negative for edema, effusion, or pneumothorax. Suspect left apical subpleural nodular scarring. No acute osseous finding. Trachea midline. IMPRESSION: No acute chest process. Electronically Signed   By: Jerilynn Mages.  Shick M.D.   On: 02/07/2019 19:19   Ct Head Wo Contrast  Result Date: 02/07/2019 CLINICAL DATA:  Acute upper extremity paresthesias EXAM: CT HEAD WITHOUT CONTRAST TECHNIQUE: Contiguous axial images were obtained from the base of the skull through the vertex without intravenous contrast. COMPARISON:  None. FINDINGS: Brain: Minor atrophy pattern. No acute intracranial hemorrhage, mass lesion, definite new infarction, midline shift, herniation, hydrocephalus, or extra-axial fluid collection. No focal mass effect or edema. Cisterns are patent. No gross cerebellar abnormality. Vascular: No hyperdense vessel. Skull: Normal. Negative for fracture or focal lesion. Sinuses/Orbits: No acute finding. Other: None. IMPRESSION: Minor atrophy.  No acute intracranial abnormality by noncontrast CT. Electronically Signed   By: Jerilynn Mages.  Shick M.D.   On: 02/07/2019 19:22   Ct Angio Neck W Or Wo Contrast  Result Date: 02/08/2019 CLINICAL DATA:  63 year old male with left arm numbness and tingling since 1400 hours yesterday. EXAM: CT ANGIOGRAPHY HEAD AND NECK TECHNIQUE: Multidetector CT imaging of the head and neck was performed using the standard protocol during bolus administration of intravenous contrast. Multiplanar CT image reconstructions and MIPs were obtained to evaluate the vascular anatomy. Carotid stenosis measurements (when applicable) are obtained utilizing NASCET criteria, using the distal internal carotid diameter as the denominator. CONTRAST:  17mL OMNIPAQUE IOHEXOL 350 MG/ML SOLN COMPARISON:  Head CT 02/07/2019. FINDINGS: CT HEAD Brain: Stable gray-white matter differentiation throughout the brain.  No midline shift, ventriculomegaly, mass effect, evidence of mass lesion, intracranial hemorrhage or evidence of cortically based acute infarction. Calvarium and skull base: No acute osseous abnormality identified. Paranasal sinuses: Visualized paranasal sinuses and mastoids are stable and well pneumatized. Orbits: Visualized orbits and scalp soft tissues are within normal limits. CTA NECK Skeleton: No acute osseous abnormality identified. Congenital incomplete ossification of the posterior C1 ring, normal variant. Upper chest: Negative. Other neck: Negative. Aortic arch: 3 vessel arch configuration. No arch atherosclerosis. Right carotid system: Mildly tortuous brachiocephalic artery and proximal right CCA without plaque or stenosis. Negative right carotid bifurcation and cervical right ICA. Left carotid system: Mildly tortuous proximal left CCA. Negative left carotid bifurcation and cervical left ICA. Vertebral arteries: Negative proximal right subclavian artery. Normal right vertebral artery origin on series 10, image 587. The right vertebral artery is mildly non dominant and patent to the skull base without stenosis. Mild proximal left subclavian artery plaque without stenosis. Normal left vertebral artery origin. Dominant left vertebral artery with mildly tortuous V1 segment which is partially obscured by dense paravertebral contrast. Otherwise the left vertebral artery is patent to the skull base without stenosis. CTA HEAD Posterior circulation: No distal vertebral stenosis, the left is dominant. Normal right PICA origin. Normal left PICA. Patent vertebrobasilar junction. Patent basilar artery is tortuous without stenosis. Normal SCA and left PCA origins. Fetal type right PCA origin. Left posterior communicating artery is diminutive or absent. Left PCA branches are within normal limits. There is mild irregularity and stenosis of the right PCA proximal P2 segment on series 14, image 53. Anterior circulation:  Both ICA siphons are patent with no plaque or stenosis.  Normal right posterior communicating artery origin. Patent carotid termini. Normal MCA and ACA origins. Tortuous A1 segments. Similar ectatic A2 segments. ACA branches otherwise within normal limits. Left MCA M1 segment and bifurcation are patent with tortuosity but no stenosis. Left MCA branches are within normal limits. Right MCA M1 and right MCA bifurcation are patent without stenosis. Right MCA branches are within normal limits. Venous sinuses: Patent. Anatomic variants: Dominant left vertebral artery. Fetal type right PCA origin. Review of the MIP images confirms the above findings IMPRESSION: 1. Negative for large vessel occlusion. 2. Generalized arterial tortuosity in the head and neck, but minimal atherosclerosis and no significant arterial stenosis. 3. CT appearance of the brain is stable since yesterday. Electronically Signed   By: Genevie Ann M.D.   On: 02/08/2019 02:56   Mr Brain Wo Contrast  Result Date: 02/08/2019 CLINICAL DATA:  Focal neuro deficit, greater than 6 hours, stroke suspected. Additional history provided: Patient reports left arm tingling/numbness since approximately 4 p.m. yesterday. EXAM: MRI HEAD WITHOUT CONTRAST TECHNIQUE: Multiplanar, multiecho pulse sequences of the brain and surrounding structures were obtained without intravenous contrast. COMPARISON:  CT angiogram head/neck 02/08/2019, head CT 02/07/2019 FINDINGS: Brain: Motion degraded examination. This includes motion degradation of both the axial and coronal diffusion-weighted imaging. A punctate focus of restricted diffusion is seen within the right temporal lobe on both axial and coronal diffusion-weighted imaging (series 4, image 22) (series 9, image 24). No evidence of intracranial mass. No midline shift or extra-axial fluid collection. Multiple small foci of SWI signal loss along left parietal lobe consistent with chronic microhemorrhages. Mild generalized  parenchymal atrophy and chronic small vessel ischemic disease. Partially empty sella turcica Vascular: Flow voids maintained within the proximal large arterial vessels. Skull and upper cervical spine: No focal marrow lesion Sinuses/Orbits: Visualized orbits demonstrate no acute abnormality. Trace ethmoid sinus mucosal thickening. No significant mastoid effusion. IMPRESSION: 1. Motion degraded examination. 2. Punctate acute infarct within the right temporal lobe. No convincing evidence of acute infarction elsewhere within the brain on motion degraded diffusion-weighted imaging. 3. Mild generalized parenchymal atrophy and chronic small vessel ischemic disease. 4. Multiple small chronic microhemorrhages within the left parietal lobe. Electronically Signed   By: Kellie Simmering   On: 02/08/2019 12:21     Labs:   Basic Metabolic Panel: Recent Labs  Lab 02/07/19 1810 02/08/19 0550  NA 139 140  K 3.9 3.9  CL 104 105  CO2 28 26  GLUCOSE 102* 97  BUN 15 13  CREATININE 1.04 0.99  CALCIUM 9.1 8.9   GFR Estimated Creatinine Clearance: 92.2 mL/min (by C-G formula based on SCr of 0.99 mg/dL). Liver Function Tests: Recent Labs  Lab 02/08/19 0550  AST 21  ALT 27  ALKPHOS 64  BILITOT 0.6  PROT 6.8  ALBUMIN 3.9   No results for input(s): LIPASE, AMYLASE in the last 168 hours. No results for input(s): AMMONIA in the last 168 hours. Coagulation profile No results for input(s): INR, PROTIME in the last 168 hours.  CBC: Recent Labs  Lab 02/07/19 1810 02/08/19 0550  WBC 8.6 9.0  HGB 14.3 14.2  HCT 43.2 43.2  MCV 98.9 98.0  PLT 219 214   Cardiac Enzymes: No results for input(s): CKTOTAL, CKMB, CKMBINDEX, TROPONINI in the last 168 hours. BNP: Invalid input(s): POCBNP CBG: No results for input(s): GLUCAP in the last 168 hours. D-Dimer No results for input(s): DDIMER in the last 72 hours. Hgb A1c No results for input(s): HGBA1C in the last 72  hours. Lipid Profile No results for  input(s): CHOL, HDL, LDLCALC, TRIG, CHOLHDL, LDLDIRECT in the last 72 hours. Thyroid function studies No results for input(s): TSH, T4TOTAL, T3FREE, THYROIDAB in the last 72 hours.  Invalid input(s): FREET3 Anemia work up No results for input(s): VITAMINB12, FOLATE, FERRITIN, TIBC, IRON, RETICCTPCT in the last 72 hours. Microbiology Recent Results (from the past 240 hour(s))  SARS CORONAVIRUS 2 (TAT 6-24 HRS) Nasopharyngeal Nasopharyngeal Swab     Status: None   Collection Time: 02/07/19 11:21 PM   Specimen: Nasopharyngeal Swab  Result Value Ref Range Status   SARS Coronavirus 2 NEGATIVE NEGATIVE Final    Comment: (NOTE) SARS-CoV-2 target nucleic acids are NOT DETECTED. The SARS-CoV-2 RNA is generally detectable in upper and lower respiratory specimens during the acute phase of infection. Negative results do not preclude SARS-CoV-2 infection, do not rule out co-infections with other pathogens, and should not be used as the sole basis for treatment or other patient management decisions. Negative results must be combined with clinical observations, patient history, and epidemiological information. The expected result is Negative. Fact Sheet for Patients: SugarRoll.be Fact Sheet for Healthcare Providers: https://www.woods-mathews.com/ This test is not yet approved or cleared by the [place] FDA and  has been authorized for detection and/or diagnosis of SARS-CoV-2 by FDA under an Emergency Use Authorization (EUA). This EUA will remain  in effect (meaning this test can be used) for the duration of the COVID-19 declaration under Section 56 4(b)(1) of the Act, 21 U.S.C. section 360bbb-3(b)(1), unless the authorization is terminated or revoked sooner. Performed at [facility], [place] [address] [place], [place] [address]      Discharge Instructions:   Discharge Instructions    Ambulatory referral to Neurology   Complete by: As  directed    Follow up in stroke clinic at [facility] with Frann Rider, NP in about 4 weeks. If not available, consider Dr. Antony Contras, Dr. Bess Harvest, or Dr. Sarina Ill.   Diet - low sodium heart healthy   Complete by: As directed    Discharge instructions   Complete by: As directed    Follow up with [facility] neurology associates in 4 weeks. Continue aspirin and plavix for 3 weeks then stay on aspirin.   Increase activity slowly   Complete by: As directed      Allergies as of 02/11/2019   No Known Allergies     Medication List    TAKE these medications   aspirin 81 MG EC tablet Take 1 tablet (81 mg total) by mouth daily. Start taking on: February 12, 2019   atorvastatin 40 MG tablet Commonly known as: LIPITOR Take 1 tablet (40 mg total) by mouth daily at 6 PM. Start taking on: February 12, 2019   clopidogrel 75 MG tablet Commonly known as: PLAVIX Take 1 tablet (75 mg total) by mouth daily for 21 days. Start taking on: February 12, 2019      Follow-up Information    [facility] Follow up in 4 week(s).   Specialty: Neurology Why: stroke clinic. office will call with appt date and time.  Contact information: [address] [place] [phone]       [place], Betty G, MD Follow up on 03/05/2019.   Specialty: Family Medicine Why: Your appointment time is 9 am. Please arrive early and bring your insurance card, picture ID and current medications. Contact information: [place]  [address] [phone]  [place] Office Follow up.   Specialty: Cardiology Why: 02/21/2019 @ 3:30PM, wound check visit (loop monitor implant) Contact information: [address], [place] [phone]          Time coordinating discharge: 39 minutes  Signed:  Mckenna Boruff  [facility] 02/11/2019,  6:53 PM

## 2019-02-11 NOTE — Discharge Instructions (Signed)
Post heart monitor implant site/wound care instructions Keep incision clean and dry for 3 days.  You can remove outer dressing tomorrow. Leave steri-strips (little pieces of tape) on until seen in the office for wound check appointment. Call the office [phone]) for redness, drainage, swelling, or fever.

## 2019-02-11 NOTE — Progress Notes (Signed)
SLP Cancellation Note  Patient Details Name: Joshua Mayer MRN: [id number] DOB: 1955-09-23   Cancelled treatment:       Reason Eval/Treat Not Completed: SLP screened, no needs identified, will sign off. Pt/wife indicate pt is at baseline level of function. They were encouraged to notify PCP if needs arise once pt returns to normal routines.   Celia B. Quentin Ore, [facility], [place] Speech Language Pathologist Office: [phone] Pager: [phone]  Shonna Chock 02/11/2019, 10:40 AM

## 2019-02-11 NOTE — H&P (View-Only) (Signed)
ELECTROPHYSIOLOGY CONSULT NOTE  Patient ID: Joshua Mayer MRN: [id number], DOB/AGE: [birth year]   Admit date: 02/07/2019 Date of Consult: 02/11/2019  Primary Physician: Patient, No Pcp Per Primary Cardiologist: none Reason for Consultation: Cryptogenic stroke ; recommendations regarding Implantable Loop Recorder, requested by Dr. Erlinda Hong  History of Present Illness Joshua Mayer was admitted on 02/07/2019 with L hand/arm weakness, stroke.  He first developed symptoms while at home.   PMHx noted only for [facility]  Neurology noted: small shower of micro embolic infarctions in the right MCA territory - embolic - unknown source.  .  he has undergone workup for stroke including echocardiogram and carotid angio.  The patient has been monitored on telemetry which has demonstrated sinus rhythm with no arrhythmias.   Inpatient stroke work-up is to be completed with a TEE.    Echocardiogram this admission demonstrated  Transthoracic Echocardiogram  02/08/2019 IMPRESSIONS 1. Left ventricular ejection fraction, by visual estimation, is 60 to 65%. The left ventricle has normal function. There is no left ventricular hypertrophy. 2. Global right ventricle has normal systolic function.The right ventricular size is normal. No increase in right ventricular wall thickness. 3. Left atrial size was normal. 4. Right atrial size was normal. 5. The mitral valve is normal in structure. Trace mitral valve regurgitation. 6. The tricuspid valve is normal in structure. Tricuspid valve regurgitation is trivial. 7. The aortic valve is normal in structure. Aortic valve regurgitation was not visualized by color flow Doppler. 8. The pulmonic valve was normal in structure. Pulmonic valve regurgitation is not visualized by color flow Doppler. 9. Aortic dilatation noted. 10. 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 11. The atrial septum is grossly normal.   Lab work is  reviewed.  Prior to admission, the patient denies chest pain, shortness of breath, dizziness, palpitations, or syncope.  He reports fully recovered from his stroke with plans to home at discharge.   Past Medical History:  Diagnosis Date   Bell's palsy      Surgical History: History reviewed. No pertinent surgical history.   No medications prior to admission.    Inpatient Medications:   aspirin EC  81 mg Oral Daily   atorvastatin  40 mg Oral q1800   clopidogrel  75 mg Oral Daily   enoxaparin (LOVENOX) injection  40 mg Subcutaneous Daily   LORazepam  1 mg Intravenous Once    Allergies: No Known Allergies  Social History   Socioeconomic History   Marital status: Married    Spouse name: Not on file   Number of children: Not on file   Years of education: Not on file   Highest education level: Not on file  Occupational History   Not on file  Social Needs   Financial resource strain: Not on file   Food insecurity    Worry: Not on file    Inability: Not on file   Transportation needs    Medical: Not on file    Non-medical: Not on file  Tobacco Use   Smoking status: Never Smoker   Smokeless tobacco: Never Used  Substance and Sexual Activity   Alcohol use: Not Currently   Drug use: Not on file   Sexual activity: Not on file  Lifestyle   Physical activity    Days per week: Not on file    Minutes per session: Not on file   Stress: Not on file  Relationships   Social connections    Talks on  phone: Not on file    Gets together: Not on file    Attends religious service: Not on file    Active member of club or organization: Not on file    Attends meetings of clubs or organizations: Not on file    Relationship status: Not on file   Intimate partner violence    Fear of current or ex partner: Not on file    Emotionally abused: Not on file    Physically abused: Not on file    Forced sexual activity: Not on file  Other Topics Concern   Not on  file  Social History Narrative   Not on file     Family History  Problem Relation Age of Onset   Transient ischemic attack Mother    Transient ischemic attack Father       Review of Systems: All other systems reviewed and are otherwise negative except as noted above.  Physical Exam: Vitals:   02/10/19 1946 02/10/19 2338 02/11/19 0438 02/11/19 0800  BP: (!) 135/94 122/68 110/79 121/81  Pulse: 64 61 (!) 57 60  Resp: 18 18 18 18   Temp: 98.3 F (36.8 C) 98.2 F (36.8 C) 98.3 F (36.8 C) 98.4 F (36.9 C)  TempSrc: Oral Oral Oral Oral  SpO2: 99% 97% 94% 98%  Weight:      Height:        GEN- The patient is well appearing, alert and oriented x 3 today.   Head- normocephalic, atraumatic Eyes-  Sclera clear, conjunctiva pink Ears- hearing intact Oropharynx- clear Neck- supple Lungs- CTA b/l, normal work of breathing Heart- RRR, no murmurs, rubs or gallops  GI- soft, NT, ND Extremities- no clubbing, cyanosis, or edema MS- no significant deformity or atrophy Skin- no rash or lesion Psych- euthymic mood, full affect   Labs:   Lab Results  Component Value Date   WBC 9.0 02/08/2019   HGB 14.2 02/08/2019   HCT 43.2 02/08/2019   MCV 98.0 02/08/2019   PLT 214 02/08/2019    Recent Labs  Lab 02/08/19 0550  NA 140  K 3.9  CL 105  CO2 26  BUN 13  CREATININE 0.99  CALCIUM 8.9  PROT 6.8  BILITOT 0.6  ALKPHOS 64  ALT 27  AST 21  GLUCOSE 97   No results found for: CKTOTAL, CKMB, CKMBINDEX, TROPONINI Lab Results  Component Value Date   CHOL 183 02/08/2019   CHOL 220 (HH) 03/22/2006   Lab Results  Component Value Date   HDL 45 02/08/2019   HDL 43.4 03/22/2006   Lab Results  Component Value Date   LDLCALC 118 (H) 02/08/2019   Lab Results  Component Value Date   TRIG 98 02/08/2019   TRIG 92 03/22/2006   Lab Results  Component Value Date   CHOLHDL 4.1 02/08/2019   CHOLHDL 5.1 CALC 03/22/2006   Lab Results  Component Value Date   LDLDIRECT 148.2  03/22/2006    No results found for: DDIMER   Radiology/Studies:    Ct Angio Head W Or Wo Contrast Result Date: 02/08/2019 CLINICAL DATA:  63 year old male with left arm numbness and tingling since 1400 hours yesterday. EXAM: CT ANGIOGRAPHY HEAD AND NECK TECHNIQUE: Multidetector CT imaging of the head and neck was performed using the standard protocol during bolus administration of intravenous contrast. Multiplanar CT image reconstructions and MIPs were obtained to evaluate the vascular anatomy. Carotid stenosis measurements (when applicable) are obtained utilizing NASCET criteria, using the distal internal carotid diameter as  the denominator. CONTRAST:  164mL OMNIPAQUE IOHEXOL 350 MG/ML SOLN COMPARISON:  Head CT 02/07/2019. FINDINGS: CT HEAD Brain: Stable gray-white matter differentiation throughout the brain. No midline shift, ventriculomegaly, mass effect, evidence of mass lesion, intracranial hemorrhage or evidence of cortically based acute infarction. Calvarium and skull base: No acute osseous abnormality identified. Paranasal sinuses: Visualized paranasal sinuses and mastoids are stable and well pneumatized. Orbits: Visualized orbits and scalp soft tissues are within normal limits. CTA NECK Skeleton: No acute osseous abnormality identified. Congenital incomplete ossification of the posterior C1 ring, normal variant. Upper chest: Negative. Other neck: Negative. Aortic arch: 3 vessel arch configuration. No arch atherosclerosis. Right carotid system: Mildly tortuous brachiocephalic artery and proximal right CCA without plaque or stenosis. Negative right carotid bifurcation and cervical right ICA. Left carotid system: Mildly tortuous proximal left CCA. Negative left carotid bifurcation and cervical left ICA. Vertebral arteries: Negative proximal right subclavian artery. Normal right vertebral artery origin on series 10, image 587. The right vertebral artery is mildly non dominant and patent to the skull  base without stenosis. Mild proximal left subclavian artery plaque without stenosis. Normal left vertebral artery origin. Dominant left vertebral artery with mildly tortuous V1 segment which is partially obscured by dense paravertebral contrast. Otherwise the left vertebral artery is patent to the skull base without stenosis. CTA HEAD Posterior circulation: No distal vertebral stenosis, the left is dominant. Normal right PICA origin. Normal left PICA. Patent vertebrobasilar junction. Patent basilar artery is tortuous without stenosis. Normal SCA and left PCA origins. Fetal type right PCA origin. Left posterior communicating artery is diminutive or absent. Left PCA branches are within normal limits. There is mild irregularity and stenosis of the right PCA proximal P2 segment on series 14, image 53. Anterior circulation: Both ICA siphons are patent with no plaque or stenosis. Normal right posterior communicating artery origin. Patent carotid termini. Normal MCA and ACA origins. Tortuous A1 segments. Similar ectatic A2 segments. ACA branches otherwise within normal limits. Left MCA M1 segment and bifurcation are patent with tortuosity but no stenosis. Left MCA branches are within normal limits. Right MCA M1 and right MCA bifurcation are patent without stenosis. Right MCA branches are within normal limits. Venous sinuses: Patent. Anatomic variants: Dominant left vertebral artery. Fetal type right PCA origin. Review of the MIP images confirms the above findings IMPRESSION: 1. Negative for large vessel occlusion. 2. Generalized arterial tortuosity in the head and neck, but minimal atherosclerosis and no significant arterial stenosis. 3. CT appearance of the brain is stable since yesterday. Electronically Signed   By: Genevie Ann M.D.   On: 02/08/2019 02:56     Dg Chest 2 View Result Date: 02/07/2019 CLINICAL DATA:  Chest pain EXAM: CHEST - 2 VIEW COMPARISON:  None. FINDINGS: Normal heart size and vascularity. No acute  airspace process, pneumonia, collapse or consolidation. Negative for edema, effusion, or pneumothorax. Suspect left apical subpleural nodular scarring. No acute osseous finding. Trachea midline. IMPRESSION: No acute chest process. Electronically Signed   By: Jerilynn Mages.  Shick M.D.   On: 02/07/2019 19:19      Ct Head Wo Contrast Result Date: 02/07/2019 CLINICAL DATA:  Acute upper extremity paresthesias EXAM: CT HEAD WITHOUT CONTRAST TECHNIQUE: Contiguous axial images were obtained from the base of the skull through the vertex without intravenous contrast. COMPARISON:  None. FINDINGS: Brain: Minor atrophy pattern. No acute intracranial hemorrhage, mass lesion, definite new infarction, midline shift, herniation, hydrocephalus, or extra-axial fluid collection. No focal mass effect or edema. Cisterns are patent. No  gross cerebellar abnormality. Vascular: No hyperdense vessel. Skull: Normal. Negative for fracture or focal lesion. Sinuses/Orbits: No acute finding. Other: None. IMPRESSION: Minor atrophy.  No acute intracranial abnormality by noncontrast CT. Electronically Signed   By: Jerilynn Mages.  Shick M.D.   On: 02/07/2019 19:22    Mr Brain Wo Contrast Result Date: 02/09/2019 CLINICAL DATA:  Follow-up stroke. Punctate right temporal lobe infarction shown on motion degraded study yesterday. EXAM: MRI HEAD WITHOUT CONTRAST TECHNIQUE: Multiplanar, multiecho pulse sequences of the brain and surrounding structures were obtained without intravenous contrast. COMPARISON:  02/08/2019 FINDINGS: Brain: Diffusion imaging is repeated today. Today's study also suffers from some motion degradation. The study confirms the presence of a punctate acute infarction in the right lateral temporal lobe. One could question a few other tiny punctate foci of restricted diffusion in the right middle cerebral artery territory, particularly along the surface of the insula and in the right temporoparietal junction region, that suggest that we are dealing  with a small shower of micro emboli in the right middle cerebral artery territory. No large or confluent infarction. No evidence swelling, mass effect or hemorrhage. IMPRESSION: Repeat diffusion imaging confirms the presence of a punctate acute infarction in the lateral right temporal lobe and suggests other tiny acute infarctions along the surface of the insula and at the right temporoparietal junction. This would indicate that we are dealing with a small shower of micro embolic infarctions in the right MCA territory. Electronically Signed   By: Nelson Chimes M.D.   On: 02/09/2019 11:06     Mr Brain Wo Contrast Result Date: 02/08/2019 CLINICAL DATA:  Focal neuro deficit, greater than 6 hours, stroke suspected. Additional history provided: Patient reports left arm tingling/numbness since approximately 4 p.m. yesterday. EXAM: MRI HEAD WITHOUT CONTRAST TECHNIQUE: Multiplanar, multiecho pulse sequences of the brain and surrounding structures were obtained without intravenous contrast. COMPARISON:  CT angiogram head/neck 02/08/2019, head CT 02/07/2019 FINDINGS: Brain: Motion degraded examination. This includes motion degradation of both the axial and coronal diffusion-weighted imaging. A punctate focus of restricted diffusion is seen within the right temporal lobe on both axial and coronal diffusion-weighted imaging (series 4, image 22) (series 9, image 24). No evidence of intracranial mass. No midline shift or extra-axial fluid collection. Multiple small foci of SWI signal loss along left parietal lobe consistent with chronic microhemorrhages. Mild generalized parenchymal atrophy and chronic small vessel ischemic disease. Partially empty sella turcica Vascular: Flow voids maintained within the proximal large arterial vessels. Skull and upper cervical spine: No focal marrow lesion Sinuses/Orbits: Visualized orbits demonstrate no acute abnormality. Trace ethmoid sinus mucosal thickening. No significant mastoid  effusion. IMPRESSION: 1. Motion degraded examination. 2. Punctate acute infarct within the right temporal lobe. No convincing evidence of acute infarction elsewhere within the brain on motion degraded diffusion-weighted imaging. 3. Mild generalized parenchymal atrophy and chronic small vessel ischemic disease. 4. Multiple small chronic microhemorrhages within the left parietal lobe. Electronically Signed   By: Kellie Simmering   On: 02/08/2019 12:21     Vas [place] Lower Extremity Venous (dvt) Result Date: 02/10/2019  Lower Venous Study Indications: Stroke.  Comparison Study: No prior study on file for comparison Performing Technologist: Sharion Dove RVS  Examination Guidelines: A complete evaluation includes B-mode imaging, spectral Doppler, color Doppler, and power Doppler as needed of all accessible portions of each vessel. Bilateral testing is considered an integral part of a complete examination. Limited examinations for reoccurring indications may be performed as noted. Summary: Right: There is no evidence  of deep vein thrombosis in the lower extremity. Left: There is no evidence of deep vein thrombosis in the lower extremity.  *See table(s) above for measurements and observations. Electronically signed by Harold Barban MD on 02/10/2019 at 4:36:05 PM.    Final     12-lead ECG SR, nonspecific IVCD All prior EKG's in EPIC reviewed with no documented atrial fibrillation  Telemetry SB/SR  Assessment and Plan:  1. Cryptogenic stroke The patient presents with cryptogenic stroke.  The patient has a TEE planned for this afternoon.  I spoke at length with the patient about monitoring for afib with either a 30 day event monitor or an implantable loop recorder.  Risks, benefits, and alteratives to implantable loop recorder were discussed with the patient today.   At this time, he is very clear in the decision to proceed with implantable loop recorder.   Wound care was reviewed with the patient (keep incision  clean and dry for 3 days).  Wound check will scheduled for the patient  Please call with questions.   Baldwin Jamaica, PA-C 02/11/2019  As above  Pt interviewed and examined Transient L UE weakness, now fully resolved with imaging concerning for embolic source--indeed B abnormalites noted  Reviewed the pathophysiology of atrial fibrillation and stroke and the role of ILR for detection of afib and the absence of prospective data on anticoagulation for afib detected in this situation but the reasoning behind our approach    He agrees and is willing to proceed and will review TEE

## 2019-02-11 NOTE — Interval H&P Note (Signed)
History and Physical Interval Note:  02/11/2019 3:17 PM  Joshua Mayer  has presented today for surgery, with the diagnosis of Stroke.  The various methods of treatment have been discussed with the patient and family. After consideration of risks, benefits and other options for treatment, the patient has consented to  Procedure(s): TRANSESOPHAGEAL ECHOCARDIOGRAM (TEE) (N/A) as a surgical intervention.  The patient's history has been reviewed, patient examined, no change in status, stable for surgery.  I have reviewed the patient's chart and labs.  Questions were answered to the patient's satisfaction.     [employer]

## 2019-02-11 NOTE — Plan of Care (Signed)
Patient is progressing towards goals.

## 2019-02-11 NOTE — Consult Note (Addendum)
ELECTROPHYSIOLOGY CONSULT NOTE  Patient ID: Joshua Mayer MRN: [id number], DOB/AGE: Apr 29, 1955   Admit date: 02/07/2019 Date of Consult: 02/11/2019  Primary Physician: Patient, No Pcp Per Primary Cardiologist: none Reason for Consultation: Cryptogenic stroke ; recommendations regarding Implantable Loop Recorder, requested by Dr. Erlinda Hong  History of Present Illness Joshua Mayer was admitted on 02/07/2019 with L hand/arm weakness, stroke.  He first developed symptoms while at home.   PMHx noted only for [facility]  Neurology noted: small shower of micro embolic infarctions in the right MCA territory - embolic - unknown source.  .  he has undergone workup for stroke including echocardiogram and carotid angio.  The patient has been monitored on telemetry which has demonstrated sinus rhythm with no arrhythmias.   Inpatient stroke work-up is to be completed with a TEE.    Echocardiogram this admission demonstrated  Transthoracic Echocardiogram  02/08/2019 IMPRESSIONS 1. Left ventricular ejection fraction, by visual estimation, is 60 to 65%. The left ventricle has normal function. There is no left ventricular hypertrophy. 2. Global right ventricle has normal systolic function.The right ventricular size is normal. No increase in right ventricular wall thickness. 3. Left atrial size was normal. 4. Right atrial size was normal. 5. The mitral valve is normal in structure. Trace mitral valve regurgitation. 6. The tricuspid valve is normal in structure. Tricuspid valve regurgitation is trivial. 7. The aortic valve is normal in structure. Aortic valve regurgitation was not visualized by color flow Doppler. 8. The pulmonic valve was normal in structure. Pulmonic valve regurgitation is not visualized by color flow Doppler. 9. Aortic dilatation noted. 10. 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 11. The atrial septum is grossly normal.   Lab work is  reviewed.  Prior to admission, the patient denies chest pain, shortness of breath, dizziness, palpitations, or syncope.  He reports fully recovered from his stroke with plans to home at discharge.   Past Medical History:  Diagnosis Date   Bell's palsy      Surgical History: History reviewed. No pertinent surgical history.   No medications prior to admission.    Inpatient Medications:   aspirin EC  81 mg Oral Daily   atorvastatin  40 mg Oral q1800   clopidogrel  75 mg Oral Daily   enoxaparin (LOVENOX) injection  40 mg Subcutaneous Daily   LORazepam  1 mg Intravenous Once    Allergies: No Known Allergies  Social History   Socioeconomic History   Marital status: Married    Spouse name: Not on file   Number of children: Not on file   Years of education: Not on file   Highest education level: Not on file  Occupational History   Not on file  Social Needs   Financial resource strain: Not on file   Food insecurity    Worry: Not on file    Inability: Not on file   Transportation needs    Medical: Not on file    Non-medical: Not on file  Tobacco Use   Smoking status: Never Smoker   Smokeless tobacco: Never Used  Substance and Sexual Activity   Alcohol use: Not Currently   Drug use: Not on file   Sexual activity: Not on file  Lifestyle   Physical activity    Days per week: Not on file    Minutes per session: Not on file   Stress: Not on file  Relationships   Social connections    Talks on  phone: Not on file    Gets together: Not on file    Attends religious service: Not on file    Active member of club or organization: Not on file    Attends meetings of clubs or organizations: Not on file    Relationship status: Not on file   Intimate partner violence    Fear of current or ex partner: Not on file    Emotionally abused: Not on file    Physically abused: Not on file    Forced sexual activity: Not on file  Other Topics Concern   Not on  file  Social History Narrative   Not on file     Family History  Problem Relation Age of Onset   Transient ischemic attack Mother    Transient ischemic attack Father       Review of Systems: All other systems reviewed and are otherwise negative except as noted above.  Physical Exam: Vitals:   02/10/19 1946 02/10/19 2338 02/11/19 0438 02/11/19 0800  BP: (!) 135/94 122/68 110/79 121/81  Pulse: 64 61 (!) 57 60  Resp: 18 18 18 18   Temp: 98.3 F (36.8 C) 98.2 F (36.8 C) 98.3 F (36.8 C) 98.4 F (36.9 C)  TempSrc: Oral Oral Oral Oral  SpO2: 99% 97% 94% 98%  Weight:      Height:        GEN- The patient is well appearing, alert and oriented x 3 today.   Head- normocephalic, atraumatic Eyes-  Sclera clear, conjunctiva pink Ears- hearing intact Oropharynx- clear Neck- supple Lungs- CTA b/l, normal work of breathing Heart- RRR, no murmurs, rubs or gallops  GI- soft, NT, ND Extremities- no clubbing, cyanosis, or edema MS- no significant deformity or atrophy Skin- no rash or lesion Psych- euthymic mood, full affect   Labs:   Lab Results  Component Value Date   WBC 9.0 02/08/2019   HGB 14.2 02/08/2019   HCT 43.2 02/08/2019   MCV 98.0 02/08/2019   PLT 214 02/08/2019    Recent Labs  Lab 02/08/19 0550  NA 140  K 3.9  CL 105  CO2 26  BUN 13  CREATININE 0.99  CALCIUM 8.9  PROT 6.8  BILITOT 0.6  ALKPHOS 64  ALT 27  AST 21  GLUCOSE 97   No results found for: CKTOTAL, CKMB, CKMBINDEX, TROPONINI Lab Results  Component Value Date   CHOL 183 02/08/2019   CHOL 220 (HH) 03/22/2006   Lab Results  Component Value Date   HDL 45 02/08/2019   HDL 43.4 03/22/2006   Lab Results  Component Value Date   LDLCALC 118 (H) 02/08/2019   Lab Results  Component Value Date   TRIG 98 02/08/2019   TRIG 92 03/22/2006   Lab Results  Component Value Date   CHOLHDL 4.1 02/08/2019   CHOLHDL 5.1 CALC 03/22/2006   Lab Results  Component Value Date   LDLDIRECT 148.2  03/22/2006    No results found for: DDIMER   Radiology/Studies:    Ct Angio Head W Or Wo Contrast Result Date: 02/08/2019 CLINICAL DATA:  63 year old male with left arm numbness and tingling since 1400 hours yesterday. EXAM: CT ANGIOGRAPHY HEAD AND NECK TECHNIQUE: Multidetector CT imaging of the head and neck was performed using the standard protocol during bolus administration of intravenous contrast. Multiplanar CT image reconstructions and MIPs were obtained to evaluate the vascular anatomy. Carotid stenosis measurements (when applicable) are obtained utilizing NASCET criteria, using the distal internal carotid diameter as  the denominator. CONTRAST:  136mL OMNIPAQUE IOHEXOL 350 MG/ML SOLN COMPARISON:  Head CT 02/07/2019. FINDINGS: CT HEAD Brain: Stable gray-white matter differentiation throughout the brain. No midline shift, ventriculomegaly, mass effect, evidence of mass lesion, intracranial hemorrhage or evidence of cortically based acute infarction. Calvarium and skull base: No acute osseous abnormality identified. Paranasal sinuses: Visualized paranasal sinuses and mastoids are stable and well pneumatized. Orbits: Visualized orbits and scalp soft tissues are within normal limits. CTA NECK Skeleton: No acute osseous abnormality identified. Congenital incomplete ossification of the posterior C1 ring, normal variant. Upper chest: Negative. Other neck: Negative. Aortic arch: 3 vessel arch configuration. No arch atherosclerosis. Right carotid system: Mildly tortuous brachiocephalic artery and proximal right CCA without plaque or stenosis. Negative right carotid bifurcation and cervical right ICA. Left carotid system: Mildly tortuous proximal left CCA. Negative left carotid bifurcation and cervical left ICA. Vertebral arteries: Negative proximal right subclavian artery. Normal right vertebral artery origin on series 10, image 587. The right vertebral artery is mildly non dominant and patent to the skull  base without stenosis. Mild proximal left subclavian artery plaque without stenosis. Normal left vertebral artery origin. Dominant left vertebral artery with mildly tortuous V1 segment which is partially obscured by dense paravertebral contrast. Otherwise the left vertebral artery is patent to the skull base without stenosis. CTA HEAD Posterior circulation: No distal vertebral stenosis, the left is dominant. Normal right PICA origin. Normal left PICA. Patent vertebrobasilar junction. Patent basilar artery is tortuous without stenosis. Normal SCA and left PCA origins. Fetal type right PCA origin. Left posterior communicating artery is diminutive or absent. Left PCA branches are within normal limits. There is mild irregularity and stenosis of the right PCA proximal P2 segment on series 14, image 53. Anterior circulation: Both ICA siphons are patent with no plaque or stenosis. Normal right posterior communicating artery origin. Patent carotid termini. Normal MCA and ACA origins. Tortuous A1 segments. Similar ectatic A2 segments. ACA branches otherwise within normal limits. Left MCA M1 segment and bifurcation are patent with tortuosity but no stenosis. Left MCA branches are within normal limits. Right MCA M1 and right MCA bifurcation are patent without stenosis. Right MCA branches are within normal limits. Venous sinuses: Patent. Anatomic variants: Dominant left vertebral artery. Fetal type right PCA origin. Review of the MIP images confirms the above findings IMPRESSION: 1. Negative for large vessel occlusion. 2. Generalized arterial tortuosity in the head and neck, but minimal atherosclerosis and no significant arterial stenosis. 3. CT appearance of the brain is stable since yesterday. Electronically Signed   By: Genevie Ann M.D.   On: 02/08/2019 02:56     Dg Chest 2 View Result Date: 02/07/2019 CLINICAL DATA:  Chest pain EXAM: CHEST - 2 VIEW COMPARISON:  None. FINDINGS: Normal heart size and vascularity. No acute  airspace process, pneumonia, collapse or consolidation. Negative for edema, effusion, or pneumothorax. Suspect left apical subpleural nodular scarring. No acute osseous finding. Trachea midline. IMPRESSION: No acute chest process. Electronically Signed   By: Jerilynn Mages.  Shick M.D.   On: 02/07/2019 19:19      Ct Head Wo Contrast Result Date: 02/07/2019 CLINICAL DATA:  Acute upper extremity paresthesias EXAM: CT HEAD WITHOUT CONTRAST TECHNIQUE: Contiguous axial images were obtained from the base of the skull through the vertex without intravenous contrast. COMPARISON:  None. FINDINGS: Brain: Minor atrophy pattern. No acute intracranial hemorrhage, mass lesion, definite new infarction, midline shift, herniation, hydrocephalus, or extra-axial fluid collection. No focal mass effect or edema. Cisterns are patent. No  gross cerebellar abnormality. Vascular: No hyperdense vessel. Skull: Normal. Negative for fracture or focal lesion. Sinuses/Orbits: No acute finding. Other: None. IMPRESSION: Minor atrophy.  No acute intracranial abnormality by noncontrast CT. Electronically Signed   By: Jerilynn Mages.  Shick M.D.   On: 02/07/2019 19:22    Mr Brain Wo Contrast Result Date: 02/09/2019 CLINICAL DATA:  Follow-up stroke. Punctate right temporal lobe infarction shown on motion degraded study yesterday. EXAM: MRI HEAD WITHOUT CONTRAST TECHNIQUE: Multiplanar, multiecho pulse sequences of the brain and surrounding structures were obtained without intravenous contrast. COMPARISON:  02/08/2019 FINDINGS: Brain: Diffusion imaging is repeated today. Today's study also suffers from some motion degradation. The study confirms the presence of a punctate acute infarction in the right lateral temporal lobe. One could question a few other tiny punctate foci of restricted diffusion in the right middle cerebral artery territory, particularly along the surface of the insula and in the right temporoparietal junction region, that suggest that we are dealing  with a small shower of micro emboli in the right middle cerebral artery territory. No large or confluent infarction. No evidence swelling, mass effect or hemorrhage. IMPRESSION: Repeat diffusion imaging confirms the presence of a punctate acute infarction in the lateral right temporal lobe and suggests other tiny acute infarctions along the surface of the insula and at the right temporoparietal junction. This would indicate that we are dealing with a small shower of micro embolic infarctions in the right MCA territory. Electronically Signed   By: Nelson Chimes M.D.   On: 02/09/2019 11:06     Mr Brain Wo Contrast Result Date: 02/08/2019 CLINICAL DATA:  Focal neuro deficit, greater than 6 hours, stroke suspected. Additional history provided: Patient reports left arm tingling/numbness since approximately 4 p.m. yesterday. EXAM: MRI HEAD WITHOUT CONTRAST TECHNIQUE: Multiplanar, multiecho pulse sequences of the brain and surrounding structures were obtained without intravenous contrast. COMPARISON:  CT angiogram head/neck 02/08/2019, head CT 02/07/2019 FINDINGS: Brain: Motion degraded examination. This includes motion degradation of both the axial and coronal diffusion-weighted imaging. A punctate focus of restricted diffusion is seen within the right temporal lobe on both axial and coronal diffusion-weighted imaging (series 4, image 22) (series 9, image 24). No evidence of intracranial mass. No midline shift or extra-axial fluid collection. Multiple small foci of SWI signal loss along left parietal lobe consistent with chronic microhemorrhages. Mild generalized parenchymal atrophy and chronic small vessel ischemic disease. Partially empty sella turcica Vascular: Flow voids maintained within the proximal large arterial vessels. Skull and upper cervical spine: No focal marrow lesion Sinuses/Orbits: Visualized orbits demonstrate no acute abnormality. Trace ethmoid sinus mucosal thickening. No significant mastoid  effusion. IMPRESSION: 1. Motion degraded examination. 2. Punctate acute infarct within the right temporal lobe. No convincing evidence of acute infarction elsewhere within the brain on motion degraded diffusion-weighted imaging. 3. Mild generalized parenchymal atrophy and chronic small vessel ischemic disease. 4. Multiple small chronic microhemorrhages within the left parietal lobe. Electronically Signed   By: Kellie Simmering   On: 02/08/2019 12:21     Vas [place] Lower Extremity Venous (dvt) Result Date: 02/10/2019  Lower Venous Study Indications: Stroke.  Comparison Study: No prior study on file for comparison Performing Technologist: Sharion Dove RVS  Examination Guidelines: A complete evaluation includes B-mode imaging, spectral Doppler, color Doppler, and power Doppler as needed of all accessible portions of each vessel. Bilateral testing is considered an integral part of a complete examination. Limited examinations for reoccurring indications may be performed as noted. Summary: Right: There is no evidence  of deep vein thrombosis in the lower extremity. Left: There is no evidence of deep vein thrombosis in the lower extremity.  *See table(s) above for measurements and observations. Electronically signed by Harold Barban MD on 02/10/2019 at 4:36:05 PM.    Final     12-lead ECG SR, nonspecific IVCD All prior EKG's in EPIC reviewed with no documented atrial fibrillation  Telemetry SB/SR  Assessment and Plan:  1. Cryptogenic stroke The patient presents with cryptogenic stroke.  The patient has a TEE planned for this afternoon.  I spoke at length with the patient about monitoring for afib with either a 30 day event monitor or an implantable loop recorder.  Risks, benefits, and alteratives to implantable loop recorder were discussed with the patient today.   At this time, he is very clear in the decision to proceed with implantable loop recorder.   Wound care was reviewed with the patient (keep incision  clean and dry for 3 days).  Wound check will scheduled for the patient  Please call with questions.   Baldwin Jamaica, PA-C 02/11/2019  As above  Pt interviewed and examined Transient L UE weakness, now fully resolved with imaging concerning for embolic source--indeed B abnormalites noted  Reviewed the pathophysiology of atrial fibrillation and stroke and the role of ILR for detection of afib and the absence of prospective data on anticoagulation for afib detected in this situation but the reasoning behind our approach    He agrees and is willing to proceed and will review TEE

## 2019-02-11 NOTE — Plan of Care (Signed)
Plan of care adequate for discharge.

## 2019-02-12 ENCOUNTER — Encounter (HOSPITAL_COMMUNITY): Payer: Self-pay | Admitting: Internal Medicine

## 2019-02-13 ENCOUNTER — Encounter (HOSPITAL_COMMUNITY): Payer: Self-pay | Admitting: Cardiology

## 2019-02-18 ENCOUNTER — Telehealth: Payer: Self-pay | Admitting: Adult Health

## 2019-02-18 NOTE — Telephone Encounter (Signed)
Pt gave consent for VV on the phone/ Pt understands that although there may be some limitations with this type of visit, we will take all precautions to reduce any security or privacy concerns.  Pt understands that this will be treated like an in office visit and we will file with pt's insurance, and there may be a patient responsible charge related to this service. °

## 2019-02-21 ENCOUNTER — Other Ambulatory Visit: Payer: Self-pay

## 2019-02-21 ENCOUNTER — Ambulatory Visit (INDEPENDENT_AMBULATORY_CARE_PROVIDER_SITE_OTHER): Payer: BC Managed Care – PPO | Admitting: *Deleted

## 2019-02-21 DIAGNOSIS — I639 Cerebral infarction, unspecified: Secondary | ICD-10-CM

## 2019-02-21 LAB — CUP PACEART INCLINIC DEVICE CHECK
Date Time Interrogation Session: 20201029155550
Implantable Pulse Generator Implant Date: 20201019

## 2019-02-21 NOTE — Progress Notes (Signed)
ILR wound check in clinic. Steri strips removed prior to visit. Wound well healed. Home monitor transmitting nightly. No episodes. Questions answered.

## 2019-02-22 ENCOUNTER — Other Ambulatory Visit: Payer: Self-pay | Admitting: *Deleted

## 2019-02-22 NOTE — Patient Outreach (Signed)
[name]) Care Management  02/22/2019  Joshua Mayer Aug 09, 1955 [id number]   Subjective: Telephone call to patient's home / mobile number, spoke with patient, and HIPAA verified.  Discussed [facility] Care Management [employer]BS PPO EMMI Stroke Red Flag Alert follow up, patient voiced understanding, and is in agreement to follow up.   Patient states he is doing very well, remembers receiving EMMI automated calls, and system captured incorrect answers.   States he does not feel worse, does not smoke, and has not been around anyone that smokes.  States he is aware of reason for hospitalization, has an appointment to establish care with a primary MD on 03/05/2019 and per chart neurologist office will call patient to schedule  4 week follow up appointment.   Patient states he is able to manage self care and has assistance as needed.  Discussed importance of hospital follow up with primary MD, patient voices understanding, and states he will follow up as appropriate.    States this is the first illness or medical issue that he has had that he could not take an over the counter medication to treat.  States he is accessing his  BCBS  benefits as needed via member services number on back of card.  Patient states he does not have any education material, EMMI follow up, care coordination, care management, disease monitoring, transportation, community resource, or pharmacy needs at this time.  States he is very appreciative of the follow up and is in agreement to receive [place] Management EMMI Stroke follow up calls as needed.     Objective: Per KPN (Knowledge Performance Now, point of care tool) and chart review, patient hospitalized 02/07/2019 - 02/11/2019 for Cryptogenic stroke.   Patient also has a history of Bell's palsy.        Assessment: Received [employer] PPO EMMI Stroke Red Flag Alert follow up referral on 02/20/2019.  Red Flag Alert Triggers, Day #6, times 2, patient answered yes to the  following questions:Feeling worse overall?  Smoked or been around smoke?   EMMI follow up completed and no further care management needs.          Plan: RNCM will complete case closure due to follow up completed / no care management needs.      Nyrah Demos H. Annia Friendly, BSN, [place] Management [facility] Telephonic CM Phone: [phone] Fax: [phone]

## 2019-02-26 ENCOUNTER — Other Ambulatory Visit: Payer: Self-pay | Admitting: *Deleted

## 2019-02-26 NOTE — Patient Outreach (Signed)
[facility]) Care Management  02/26/2019  Joshua Mayer 02-Jan-1956 [id number]   Subjective: Telephone call to patient's home / mobile number, spoke with patient, and HIPAA verified.  Discussed [facility] Care Management BCBS PPO EMMI Stroke Red Flag Alert follow up, patient voiced understanding, and is in agreement to follow up.   Patient states he is doing very well, remembers receiving EMMI automated calls, was not able to answer the phone, and system captured incorrect answers.   States he has not lost interest in things they used to enjoy.  States nothing has changed since our previous conversation on 02/22/2019 and will call this RNCM if assistance needed.    Patient states he does not have any education material, EMMI follow up, care coordination, care management, disease monitoring, transportation, community resource, or pharmacy needs at this time.  States he is very appreciative of the follow up and is in agreement to receive [place] Management EMMI Stroke follow up calls as needed.     Objective: Per KPN (Knowledge Performance Now, point of care tool) and chart review, patient hospitalized 02/07/2019 - 02/11/2019 for Cryptogenic stroke.   Patient also has a history of Bell's palsy.        Assessment: Received [employer]BS PPO EMMI Stroke Red Flag Alert follow up referral on 02/25/2019.  Red Flag Alert Triggers, Day # 9, patient answered yes to the following questions:Lost interest in things they used to enjoy?   EMMI follow up completed and no further care management needs.           Plan: RNCM will complete case closure due to follow up completed / no care management needs.      Joshua Mayer H. Annia Friendly, BSN, [place] Management [facility] Telephonic CM Phone: [phone] Fax: [phone]

## 2019-03-01 ENCOUNTER — Other Ambulatory Visit: Payer: Self-pay | Admitting: *Deleted

## 2019-03-01 NOTE — Patient Outreach (Signed)
[facility]) Care Management  03/01/2019  Joshua Mayer [birth year] [id number]   Received BCBS PPO EMMI Stroke Red Flag Alert follow up referral on 02/27/2019. Red Flag Alert Triggers, Day # 13, patient answered no to the following question:Went to follow-up appointment? RNCM has spoken with patient on 02/22/2019 and 02/26/2019, verified follow up appointment with new primary scheduled for 03/05/2019.  No outreach needed at this time due to patient follow up scheduled with new primary on 03/15/2019.    EMMI follow up completed and no further care management needs.      Per previous patient verbal consent, patient is in agreement to receive [place] Management EMMI Stroke follow up calls as needed.    Case will remain closed.    Mohini Heathcock H. Annia Friendly, BSN, [place] Management [facility] Telephonic CM Phone: [phone] Fax: [phone]

## 2019-03-05 ENCOUNTER — Encounter: Payer: Self-pay | Admitting: Family Medicine

## 2019-03-05 ENCOUNTER — Other Ambulatory Visit: Payer: Self-pay

## 2019-03-05 ENCOUNTER — Ambulatory Visit: Payer: BC Managed Care – PPO | Admitting: Family Medicine

## 2019-03-05 ENCOUNTER — Encounter: Payer: Self-pay | Admitting: Internal Medicine

## 2019-03-05 VITALS — BP 126/82 | Temp 98.0°F | Resp 12 | Ht 72.0 in | Wt 211.0 lb

## 2019-03-05 DIAGNOSIS — I639 Cerebral infarction, unspecified: Secondary | ICD-10-CM

## 2019-03-05 DIAGNOSIS — Z0000 Encounter for general adult medical examination without abnormal findings: Secondary | ICD-10-CM

## 2019-03-05 DIAGNOSIS — Z1159 Encounter for screening for other viral diseases: Secondary | ICD-10-CM | POA: Diagnosis not present

## 2019-03-05 DIAGNOSIS — Z125 Encounter for screening for malignant neoplasm of prostate: Secondary | ICD-10-CM | POA: Diagnosis not present

## 2019-03-05 DIAGNOSIS — E785 Hyperlipidemia, unspecified: Secondary | ICD-10-CM | POA: Insufficient documentation

## 2019-03-05 DIAGNOSIS — E7800 Pure hypercholesterolemia, unspecified: Secondary | ICD-10-CM

## 2019-03-05 DIAGNOSIS — Z1211 Encounter for screening for malignant neoplasm of colon: Secondary | ICD-10-CM | POA: Diagnosis not present

## 2019-03-05 DIAGNOSIS — Z9189 Other specified personal risk factors, not elsewhere classified: Secondary | ICD-10-CM

## 2019-03-05 DIAGNOSIS — Z23 Encounter for immunization: Secondary | ICD-10-CM | POA: Diagnosis not present

## 2019-03-05 LAB — PSA: PSA: 1.95 ng/mL (ref 0.10–4.00)

## 2019-03-05 NOTE — Patient Instructions (Addendum)
A few things to remember from today's visit:   Routine general medical examination at a health care facility  Prostate cancer screening - Plan: PSA(Must document that pt has been informed of limitations of PSA testing.)  Encounter for HCV screening test for high risk patient - Plan: Hepatitis C antibody screen  Colon cancer screening - Plan: Ambulatory referral to Gastroenterology  Cryptogenic stroke ([place])  Pure hypercholesterolemia   At least 150 minutes of moderate exercise per week, daily brisk walking for 15-30 min is a good exercise option. Healthy diet low in saturated (animal) fats and sweets and consisting of fresh fruits and vegetables, lean meats such as fish and white chicken and whole grains.  - Vaccines:  Tdap vaccine every 10 years.  Shingles vaccine recommended at age 68, could be given after 64 years of age but not sure about insurance coverage.  Pneumonia vaccines:  Prevnar 34 at 26 and Pneumovax at 51.   -Screening recommendations for low/normal risk males:  Screening for diabetes at age 69 and every 3 years. Earlier screening if cardiovascular risk factors.  Colon cancer screening at age 15 and until age 66.  Prostate cancer screening: some controversy, starts usually at 20: Rectal exam and PSA.  Aortic Abdominal Aneurism once between 57 and 13 years old if ever smoker.  Also recommended:  1. Dental visit- Brush and floss your teeth twice daily; visit your dentist twice a year. 2. Eye doctor- Get an eye exam at least every 2 years. 3. Helmet use- Always wear a helmet when riding a bicycle, motorcycle, rollerblading or skateboarding. 4. Safe sex- If you may be exposed to sexually transmitted infections, use a condom. 5. Seat belts- Seat belts can save your live; always wear one. 6. Smoke/Carbon Monoxide detectors- These detectors need to be installed on the appropriate level of your home. Replace batteries at least once a year. 7. Skin cancer- When out  in the sun please cover up and use sunscreen 15 SPF or higher. 8. Violence- If anyone is threatening or hurting you, please tell your healthcare provider.  9. Drink alcohol in moderation- Limit alcohol intake to one drink or less per day. Never drink and drive.

## 2019-03-05 NOTE — Progress Notes (Signed)
HPI:   Joshua EDWARD ILYAS Mayer. is a 63 y.o. male, who is here today to establish care.  Former PCP: N/A Last preventive routine visit: 10+ years ago.  Chronic medical problems: Recently hospitalized for cryptogenic stroke.Complete recovery with no residual deficit. S/P loop recorder placement 02/11/19. Echo 02/11/19 LVEF 60-65%.  Past Hx of Bell's Palsy.  HLD: He is on Atorvastatin 40 mg daily.  Lab Results  Component Value Date   CHOL 183 02/08/2019   HDL 45 02/08/2019   LDLCALC 118 (H) 02/08/2019   LDLDIRECT 148.2 03/22/2006   TRIG 98 02/08/2019   CHOLHDL 4.1 02/08/2019    Concerns today: He would like to have a preventive visit today.  Last CPE many years ago. He has not had prostate cancer screening or colonoscopy.  He lives with his wife and 2 sons. Exercises regularly, walks the treadmill. He is following a healthful diet.  Denies hx of tobacco or illicit drug use.'Drinks alcohol occasionally.   Review of Systems  Constitutional: Negative for activity change, appetite change, fatigue, fever and unexpected weight change.  HENT: Negative for dental problem, nosebleeds, sore throat, trouble swallowing and voice change.   Eyes: Negative for redness and visual disturbance.  Respiratory: Negative for cough, shortness of breath and wheezing.   Cardiovascular: Negative for chest pain, palpitations and leg swelling.  Gastrointestinal: Negative for abdominal pain, blood in stool, nausea and vomiting.  Endocrine: Negative for cold intolerance, heat intolerance, polydipsia, polyphagia and polyuria.  Genitourinary: Negative for decreased urine volume, dysuria, genital sores, hematuria and testicular pain.  Musculoskeletal: Negative for gait problem and myalgias.  Skin: Negative for color change and rash.  Allergic/Immunologic: Negative for environmental allergies.  Neurological: Negative for syncope, weakness and headaches.  Hematological: Negative for  adenopathy. Does not bruise/bleed easily.  Psychiatric/Behavioral: Negative for confusion and sleep disturbance. The patient is not nervous/anxious.   All other systems reviewed and are negative.  Current Outpatient Medications on File Prior to Visit  Medication Sig Dispense Refill  . aspirin EC 81 MG EC tablet Take 1 tablet (81 mg total) by mouth daily. 30 tablet 3  . atorvastatin (LIPITOR) 40 MG tablet Take 1 tablet (40 mg total) by mouth daily at 6 PM. 30 tablet 2   No current facility-administered medications on file prior to visit.     Past Medical History:  Diagnosis Date  . Bell's palsy    No Known Allergies  Family History  Problem Relation Age of Onset  . Transient ischemic attack Mother   . Transient ischemic attack Father     Social History   Socioeconomic History  . Marital status: Married    Spouse name: Not on file  . Number of children: Not on file  . Years of education: Not on file  . Highest education level: Not on file  Occupational History  . Not on file  Social Needs  . Financial resource strain: Not on file  . Food insecurity    Worry: Not on file    Inability: Not on file  . Transportation needs    Medical: Not on file    Non-medical: Not on file  Tobacco Use  . Smoking status: Never Smoker  . Smokeless tobacco: Never Used  Substance and Sexual Activity  . Alcohol use: Not Currently  . Drug use: Not on file  . Sexual activity: Not on file  Lifestyle  . Physical activity    Days per week: Not on file  Minutes per session: Not on file  . Stress: Not on file  Relationships  . Social Herbalist on phone: Not on file    Gets together: Not on file    Attends religious service: Not on file    Active member of club or organization: Not on file    Attends meetings of clubs or organizations: Not on file    Relationship status: Not on file  Other Topics Concern  . Not on file  Social History Narrative  . Not on file    Vitals:    03/05/19 0903  BP: 126/82  Resp: 12  Temp: 98 F (36.7 C)    Body mass index is 28.62 kg/m.  Physical Exam  Nursing note and vitals reviewed. Constitutional: He is oriented to person, place, and time. He appears well-developed. No distress.  HENT:  Head: Atraumatic.  Right Ear: Tympanic membrane, external ear and ear canal normal.  Left Ear: Tympanic membrane, external ear and ear canal normal.  Mouth/Throat: Oropharynx is clear and moist and mucous membranes are normal.  Eyes: Pupils are equal, round, and reactive to light. Conjunctivae and EOM are normal.  Neck: Normal range of motion. No tracheal deviation present. No thyromegaly present.  Cardiovascular: Normal rate and regular rhythm.  No murmur heard. Pulses:      Dorsalis pedis pulses are 2+ on the right side and 2+ on the left side.  Respiratory: Effort normal and breath sounds normal. No respiratory distress.  GI: Soft. He exhibits no mass. There is no hepatomegaly. There is no abdominal tenderness.  Genitourinary:    Genitourinary Comments: Refused,no concerns.   Musculoskeletal:        General: No tenderness or edema.     Comments: No major deformities appreciated and no signs of synovitis.  Lymphadenopathy:    He has no cervical adenopathy.       Right: No supraclavicular adenopathy present.       Left: No supraclavicular adenopathy present.  Neurological: He is alert and oriented to person, place, and time. He has normal strength. No cranial nerve deficit or sensory deficit. Coordination and gait normal.  Reflex Scores:      Bicep reflexes are 2+ on the right side and 2+ on the left side.      Patellar reflexes are 2+ on the right side and 2+ on the left side. Skin: Skin is warm. No erythema.  Psychiatric: He has a normal mood and affect. Cognition and memory are normal.   ASSESSMENT AND PLAN:  Mr. CHISHOLM was seen today for establish care and annual exam.  Diagnoses and all orders for this visit:   Orders Placed This Encounter  Procedures  . Tdap vaccine greater than or equal to 7yo IM  . Varicella-zoster vaccine IM (Shingrix)  . PSA(Must document that pt has been informed of limitations of PSA testing.)  . Hepatitis C antibody screen  . Ambulatory referral to Gastroenterology   Lab Results  Component Value Date   PSA 1.95 03/05/2019   Routine general medical examination at a health care facility We discussed the importance of regular physical activity and healthy diet for prevention of chronic illness and/or complications. Preventive guidelines reviewed. Vaccination updated.  Next CPE in a year.  Prostate cancer screening -     PSA(Must document that pt has been informed of limitations of PSA testing.)  Encounter for HCV screening test for high risk patient -     Hepatitis C antibody screen  Colon cancer screening -     Ambulatory referral to Gastroenterology  Cryptogenic stroke [facility]) No residual focal deficit. Negative work up. Has a cardiac loop recorder. Instructed about warning signs.  Pure hypercholesterolemia Continue Atorvastatin 40 mg daily.  Need for shingles vaccine -     Varicella-zoster vaccine IM (Shingrix)  Need for tetanus booster -     Tdap vaccine greater than or equal to 7yo IM   Return in 5 months (on 08/03/2019).     Durant Scibilia G. [place], MD  [facility]. [place] office.

## 2019-03-06 ENCOUNTER — Encounter: Payer: Self-pay | Admitting: Family Medicine

## 2019-03-06 LAB — HEPATITIS C ANTIBODY
Hepatitis C Ab: NONREACTIVE
SIGNAL TO CUT-OFF: 0.01 (ref <1.00)

## 2019-03-09 ENCOUNTER — Encounter: Payer: Self-pay | Admitting: Family Medicine

## 2019-03-11 ENCOUNTER — Other Ambulatory Visit: Payer: Self-pay | Admitting: Family Medicine

## 2019-03-11 MED ORDER — FLUTICASONE PROPIONATE 0.05 % EX CREA: TOPICAL_CREAM | Freq: Two times a day (BID) | CUTANEOUS | 0 refills | Status: DC | PRN

## 2019-03-18 ENCOUNTER — Telehealth (INDEPENDENT_AMBULATORY_CARE_PROVIDER_SITE_OTHER): Payer: BC Managed Care – PPO | Admitting: Adult Health

## 2019-03-18 ENCOUNTER — Ambulatory Visit (INDEPENDENT_AMBULATORY_CARE_PROVIDER_SITE_OTHER): Payer: BC Managed Care – PPO | Admitting: *Deleted

## 2019-03-18 ENCOUNTER — Encounter: Payer: Self-pay | Admitting: Adult Health

## 2019-03-18 DIAGNOSIS — E785 Hyperlipidemia, unspecified: Secondary | ICD-10-CM | POA: Diagnosis not present

## 2019-03-18 DIAGNOSIS — I639 Cerebral infarction, unspecified: Secondary | ICD-10-CM

## 2019-03-18 DIAGNOSIS — I10 Essential (primary) hypertension: Secondary | ICD-10-CM

## 2019-03-18 DIAGNOSIS — Z95818 Presence of other cardiac implants and grafts: Secondary | ICD-10-CM

## 2019-03-18 NOTE — Progress Notes (Signed)
[facility] [address] [place]. [place] [address] [phone]       HOSPITAL FOLLOW UP NOTE  [name] [facility]. Date of Birth:  08-07-1955 Medical Record Number:  [id number]   Reason for Referral:  hospital stroke follow up  Virtual Visit via Video Note  I connected with Joshua Mayer. on 03/18/19 at  9:15 AM EST by a video enabled telemedicine application located at [facility] and verified that I am speaking with the correct person using two identifiers who was located at their own home accompanied by his wife.   Visit scheduled by Preston Fleeting (referrals department) who discussed the limitations of evaluation and management by telemedicine and the availability of in person appointments. The patient expressed understanding and agreed to proceed.   HPI: [place] being seen today via virtual visit for hospital follow-up regarding cryptogenic multiple infarcts right MCA territory with loop recorder placement on 02/07/2019.  History obtained from patient, wife and chart review. Reviewed all radiology images and labs personally.  [name] is a 63 y.o. male with history of Bell's Palsey  presented on 02/07/2019 with LUE weakness / numbness.   Stroke work-up revealed small shower of microembolic infarctions in the right MCA territory embolic as evidenced on MRI secondary to unknown source.  He did not receive IV t-PA due to late presentation (>4.5 hours from time of onset).  In addition to acute infarcts, MRI also showed evidence of multiple small chronic microhemorrhages within left parietal lobe.  CTA head/neck negative LVO with generalized atrial Tricity in the head/neck but minimal arthrosclerosis and no significant arterial stenosis.  2D echo normal EF without cardiac source of embolus identified.  LE venous Dopplers negative DVT.  TEE unremarkable without evidence of PFO.  Loop recorder placed to rule  out cardiac source and stroke etiology.  COVID-19 negative.  LDL 118.  A1c 5.5.  Recommended DAPT for 3 weeks and aspirin alone as previously not on antithrombotic.  HTN stable.  LDL 118 and initiated atorvastatin 40 mg daily.  Other stroke risk factors include advanced age, obesity and family history of TIAs but no personal history of TIA/stroke.  Discussion during admission regarding Remi Haggard trial with patient interested in additional information provided.  He was discharged home in stable condition without therapy needs.  Joshua Mayer is a 63 year old male who is being seen today via virtual visit for hospital follow-up.  He has been doing well from a stroke standpoint without residual deficits or new stroke/TIA symptoms.  Wife does endorse mild short-term memory concerns potentially present prior to his stroke and unsure if worsened since stroke.  He has since returned back to work working for [employer] company remotely from his home.  Has returned to work without difficulty.  Loop recorder is not shown atrial fibrillation thus far.  He was evaluated for [place] trial but was deemed to not be a candidate.  Completed 3 weeks DAPT and continues on aspirin alone without bleeding or bruising.  Continues on atorvastatin 40 mg daily without myalgias.  Blood pressures not routinely monitor at home but recently establish care with PCP with satisfactory level.  No further concerns at this time.     ROS:   14 system review of systems performed and negative with exception of short-term memory  PMH:  Past Medical History:  Diagnosis Date   Bell's palsy     PSH:  Past Surgical History:  Procedure Laterality Date  BUBBLE STUDY  02/11/2019   Procedure: BUBBLE STUDY;  Surgeon: Jerline Pain, MD;  Location: [facility] ENDOSCOPY;  Service: Cardiovascular;;   LOOP RECORDER INSERTION N/A 02/11/2019   Procedure: LOOP RECORDER INSERTION;  Surgeon: Deboraha Sprang, MD;  Location: [place] CV LAB;  Service:  Cardiovascular;  Laterality: N/A;   TEE WITHOUT CARDIOVERSION N/A 02/11/2019   Procedure: TRANSESOPHAGEAL ECHOCARDIOGRAM (TEE);  Surgeon: Jerline Pain, MD;  Location: [facility] ENDOSCOPY;  Service: Cardiovascular;  Laterality: N/A;    Social History:  Social History   Socioeconomic History   Marital status: Married    Spouse name: Not on file   Number of children: Not on file   Years of education: Not on file   Highest education level: Not on file  Occupational History   Not on file  Social Needs   Financial resource strain: Not on file   Food insecurity    Worry: Not on file    Inability: Not on file   Transportation needs    Medical: Not on file    Non-medical: Not on file  Tobacco Use   Smoking status: Never Smoker   Smokeless tobacco: Never Used  Substance and Sexual Activity   Alcohol use: Not Currently   Drug use: Not on file   Sexual activity: Not on file  Lifestyle   Physical activity    Days per week: Not on file    Minutes per session: Not on file   Stress: Not on file  Relationships   Social connections    Talks on phone: Not on file    Gets together: Not on file    Attends religious service: Not on file    Active member of club or organization: Not on file    Attends meetings of clubs or organizations: Not on file    Relationship status: Not on file   Intimate partner violence    Fear of current or ex partner: Not on file    Emotionally abused: Not on file    Physically abused: Not on file    Forced sexual activity: Not on file  Other Topics Concern   Not on file  Social History Narrative   Not on file    Family History:  Family History  Problem Relation Age of Onset   Transient ischemic attack Mother    Transient ischemic attack Father     Medications:   Current Outpatient Medications on File Prior to Visit  Medication Sig Dispense Refill   aspirin EC 81 MG EC tablet Take 1 tablet (81 mg total) by mouth daily. 30 tablet 3    atorvastatin (LIPITOR) 40 MG tablet Take 1 tablet (40 mg total) by mouth daily at 6 PM. 30 tablet 2   fluticasone (CUTIVATE) 0.05 % cream Apply topically 2 (two) times daily as needed. 30 g 0   No current facility-administered medications on file prior to visit.     Allergies:  No Known Allergies   Physical Exam *Limited exam due to visit type*  General: well developed, well nourished, pleasant middle-age Caucasian male, seated, in no evident distress Head: head normocephalic and atraumatic.    Neurologic Exam Mental Status: Awake and fully alert. Oriented to place and time. Recent and remote memory intact. Attention span, concentration and fund of knowledge appropriate. Mood and affect appropriate.  Cranial Nerves: Extraocular movements full without nystagmus. Hearing intact to voice. Facial sensation intact. Face, tongue, palate moves normally and symmetrically.  Shoulder shrug symmetric. Motor: No  evidence of weakness per drift assessment Sensory.: intact to light touch Coordination: Rapid alternating movements normal in all extremities. Finger-to-nose and heel-to-shin performed accurately bilaterally. Gait and Station: Arises from chair without difficulty. Stance is normal. Gait demonstrates normal stride length and balance .  Reflexes: UTA    NIHSS  0 Modified Rankin  1   Diagnostic Data (Labs, Imaging, Testing)  CT HEAD WO CONTRAST 02/07/2019 IMPRESSION: Minor atrophy.  No acute intracranial abnormality by noncontrast CT.  CT ANGIO HEAD W OR WO CONTRAST CT ANGIO NECK W OR WO CONTRAST 02/08/2019 IMPRESSION: 1. Negative for large vessel occlusion. 2. Generalized arterial tortuosity in the head and neck, but minimal atherosclerosis and no significant arterial stenosis. 3. CT appearance of the brain is stable since yesterday.  MR BRAIN WO CONTRAST 02/08/2019 IMPRESSION: 1. Motion degraded examination. 2. Punctate acute infarct within the right temporal lobe.  No convincing evidence of acute infarction elsewhere within the brain on motion degraded diffusion-weighted imaging. 3. Mild generalized parenchymal atrophy and chronic small vessel ischemic disease. 4. Multiple small chronic microhemorrhages within the left parietal lobe.  MR BRAIN WO CONTRAST 02/09/2019 IMPRESSION: Repeat diffusion imaging confirms the presence of a punctate acute infarction in the lateral right temporal lobe and suggests other tiny acute infarctions along the surface of the insula and at the right temporoparietal junction. This would indicate that we are dealing with a small shower of micro embolic infarctions in the right MCA territory.  ECHOCARDIOGRAM 02/08/2019 IMPRESSIONS  1. Left ventricular ejection fraction, by visual estimation, is 60 to 65%. The left ventricle has normal function. There is no left ventricular hypertrophy.  2. Global right ventricle has normal systolic function.The right ventricular size is normal. No increase in right ventricular wall thickness.  3. Left atrial size was normal.  4. Right atrial size was normal.  5. The mitral valve is normal in structure. Trace mitral valve regurgitation.  6. The tricuspid valve is normal in structure. Tricuspid valve regurgitation is trivial.  7. The aortic valve is normal in structure. Aortic valve regurgitation was not visualized by color flow Doppler.  8. The pulmonic valve was normal in structure. Pulmonic valve regurgitation is not visualized by color flow Doppler.  9. Aortic dilatation noted. 10. 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 11. The atrial septum is grossly normal.  ECHO TEE 02/11/2019 IMPRESSIONS    1. Left ventricular ejection fraction, by visual estimation, is 60 to 65%. The left ventricle has normal function. Normal left ventricular size. There is no left ventricular hypertrophy.  2. Global right ventricle has normal systolic function.The right  ventricular size is normal. No increase in right ventricular wall thickness.  3. Left atrial size was normal.  4. Right atrial size was normal.  5. The mitral valve is normal in structure. Trace mitral valve regurgitation. No evidence of mitral stenosis.  6. The tricuspid valve is normal in structure. Tricuspid valve regurgitation is trivial.  7. The aortic valve is tricuspid Aortic valve regurgitation was not visualized by color flow Doppler. Structurally normal aortic valve, with no evidence of sclerosis or stenosis.  8. The pulmonic valve was normal in structure. Pulmonic valve regurgitation is not visualized by color flow Doppler.  9. The inferior vena cava is normal in size with greater than 50% respiratory variability, suggesting right atrial pressure of 3 mmHg. 10. No embolic source identified.    ASSESSMENT: Joshua Mayer. is a 63 y.o. year old male presented with LUE  weakness/numbness on 02/07/2019 with stroke work-up revealing small shower microembolic infarctions within the right MCA territory embolic pattern secondary to unknown source with loop recorder placed. Vascular risk factors include HTN, HLD, Bell's palsy and family history of stroke.  Recovered well from a stroke standpoint with residual mild short-term memory concerns potentially present prior to his stroke but has returned back to work without difficulty    PLAN:  1. Right MCA strokes: Continue aspirin 81 mg daily  and atorvastatin for secondary stroke prevention. Maintain strict control of hypertension with blood pressure goal below 130/90, diabetes with hemoglobin A1c goal below 6.5% and cholesterol with LDL cholesterol (bad cholesterol) goal below 70 mg/dL.  I also advised the patient to eat a healthy diet with plenty of whole grains, cereals, fruits and vegetables, exercise regularly with at least 30 minutes of continuous activity daily and maintain ideal body weight.  Continue to monitor loop recorder for  potential atrial fibrillation.  Discussion regarding short-term memory concerns post stroke potentially present prior.  Discussion regarding short-term memory complaints should improve over the next couple months 2. HTN: Advised to continue current treatment regimen.  Recommended monitoring at home and ongoing follow-up with PCP 3. HLD: Advised to continue current treatment regimen along with continued follow-up with PCP for future prescribing and monitoring of lipid panel   Follow up in 4 months or call earlier if needed   Greater than 50% of time during this 30 minute nonface-to-face visit was spent on counseling, explanation of diagnosis of right MCA strokes, reviewing risk factor management of HTN and HLD, planning of further management along with potential future management, discussion regarding Remi Haggard trial and patient deemed not a candidate, and discussion with patient and family answering all questions.    Frann Rider, AGNP-BC  [facility] [address] [place],  [address]  Phone [phone] Fax [phone] Note: This document was prepared with digital dictation and possible smart phrase technology. Any transcriptional errors that result from this process are unintentional.

## 2019-03-19 LAB — CUP PACEART REMOTE DEVICE CHECK
Date Time Interrogation Session: 20201123202736
Implantable Pulse Generator Implant Date: 20201019

## 2019-03-25 ENCOUNTER — Telehealth: Payer: Self-pay | Admitting: *Deleted

## 2019-03-25 ENCOUNTER — Encounter: Payer: Self-pay | Admitting: Adult Health

## 2019-03-25 NOTE — Telephone Encounter (Signed)
If he is on antiplatelet (other than aspirin) or anticoagulant, then OV prior. If not, then direct Colon in [place] (stay on aspirin) after 3 months, would be fine. Thanks

## 2019-03-25 NOTE — Telephone Encounter (Signed)
Called patient, no answer. Left a message for the patient to call me back. 

## 2019-03-25 NOTE — Telephone Encounter (Signed)
This patient is for a direct screening colonoscopy. On 02/07/2019 pt was seen in ED for Cryptogenic stroke, TIA. Loop recorder was placed on 02/11/2019. Would you like the patient to have an office visit first or direct colonoscopy past the 3 month protocol. Please advise. Thank you, Latrina Guttman pv

## 2019-03-26 NOTE — Telephone Encounter (Signed)
Spoke with the patient and gave him Dr.Perry recommendations. Patient denies taking any blood thinners at this time, just baby Aspirin. New colonoscopy appointment made for 05/24/2019 at 9 am, PV 1/15 at 9 am and covid test on 1/26 at 8 am. Pt is aware. Letter mailed to pt also.

## 2019-03-28 ENCOUNTER — Encounter (HOSPITAL_COMMUNITY): Payer: Self-pay | Admitting: Cardiology

## 2019-04-10 ENCOUNTER — Other Ambulatory Visit: Payer: Self-pay | Admitting: *Deleted

## 2019-04-10 ENCOUNTER — Encounter: Payer: Self-pay | Admitting: Family Medicine

## 2019-04-10 MED ORDER — ATORVASTATIN CALCIUM 40 MG PO TABS: 40.0000 mg | ORAL_TABLET | Freq: Every day | ORAL | 0 refills | Status: DC

## 2019-04-14 NOTE — Addendum Note (Signed)
Addended by: Patsey Berthold on: 04/14/2019 09:08 PM   Modules accepted: Level of Service

## 2019-04-15 ENCOUNTER — Encounter: Payer: BC Managed Care – PPO | Admitting: Internal Medicine

## 2019-04-18 ENCOUNTER — Ambulatory Visit (INDEPENDENT_AMBULATORY_CARE_PROVIDER_SITE_OTHER): Payer: BC Managed Care – PPO | Admitting: *Deleted

## 2019-04-18 DIAGNOSIS — I639 Cerebral infarction, unspecified: Secondary | ICD-10-CM

## 2019-04-18 LAB — CUP PACEART REMOTE DEVICE CHECK
Date Time Interrogation Session: 20201224090106
Implantable Pulse Generator Implant Date: 20201019

## 2019-04-21 NOTE — Progress Notes (Signed)
ILR remote 

## 2019-05-20 ENCOUNTER — Ambulatory Visit (INDEPENDENT_AMBULATORY_CARE_PROVIDER_SITE_OTHER): Payer: BC Managed Care – PPO | Admitting: *Deleted

## 2019-05-20 DIAGNOSIS — I639 Cerebral infarction, unspecified: Secondary | ICD-10-CM | POA: Diagnosis not present

## 2019-05-20 LAB — CUP PACEART REMOTE DEVICE CHECK
Date Time Interrogation Session: 20210124230122
Implantable Pulse Generator Implant Date: 20201019

## 2019-05-24 ENCOUNTER — Encounter: Payer: BC Managed Care – PPO | Admitting: Internal Medicine

## 2019-06-20 ENCOUNTER — Ambulatory Visit (INDEPENDENT_AMBULATORY_CARE_PROVIDER_SITE_OTHER): Payer: BC Managed Care – PPO | Admitting: *Deleted

## 2019-06-20 DIAGNOSIS — I639 Cerebral infarction, unspecified: Secondary | ICD-10-CM

## 2019-06-20 LAB — CUP PACEART REMOTE DEVICE CHECK
Date Time Interrogation Session: 20210224230232
Implantable Pulse Generator Implant Date: 20201019

## 2019-06-20 NOTE — Progress Notes (Signed)
ILR Remote 

## 2019-06-25 ENCOUNTER — Other Ambulatory Visit: Payer: Self-pay | Admitting: Family Medicine

## 2019-06-25 MED ORDER — ATORVASTATIN CALCIUM 40 MG PO TABS: 40.0000 mg | ORAL_TABLET | Freq: Every day | ORAL | 2 refills | Status: DC

## 2019-07-16 ENCOUNTER — Encounter: Payer: Self-pay | Admitting: Adult Health

## 2019-07-16 ENCOUNTER — Other Ambulatory Visit: Payer: Self-pay

## 2019-07-16 ENCOUNTER — Ambulatory Visit (INDEPENDENT_AMBULATORY_CARE_PROVIDER_SITE_OTHER): Payer: BC Managed Care – PPO | Admitting: Adult Health

## 2019-07-16 VITALS — BP 147/85 | HR 53 | Temp 97.1°F | Ht 73.0 in | Wt 211.5 lb

## 2019-07-16 DIAGNOSIS — I639 Cerebral infarction, unspecified: Secondary | ICD-10-CM | POA: Diagnosis not present

## 2019-07-16 DIAGNOSIS — E785 Hyperlipidemia, unspecified: Secondary | ICD-10-CM | POA: Diagnosis not present

## 2019-07-16 DIAGNOSIS — Z95818 Presence of other cardiac implants and grafts: Secondary | ICD-10-CM | POA: Diagnosis not present

## 2019-07-16 DIAGNOSIS — I10 Essential (primary) hypertension: Secondary | ICD-10-CM

## 2019-07-16 DIAGNOSIS — I69319 Unspecified symptoms and signs involving cognitive functions following cerebral infarction: Secondary | ICD-10-CM

## 2019-07-16 NOTE — Patient Instructions (Signed)
Continue aspirin 81 mg daily  and atorvastatin for secondary stroke prevention  Continue to follow up with PCP regarding cholesterol and blood pressure management   Loop recorder will continue to be monitored for possible atrial fibrillation  Continue to monitor blood pressure at home  Maintain strict control of hypertension with blood pressure goal below 130/90, diabetes with hemoglobin A1c goal below 6.5% and cholesterol with LDL cholesterol (bad cholesterol) goal below 70 mg/dL. I also advised the patient to eat a healthy diet with plenty of whole grains, cereals, fruits and vegetables, exercise regularly and maintain ideal body weight.  Followup in the future with me in 6 months or call earlier if needed       Thank you for coming to see [place] at [facility]. I hope we have been able to provide you high quality care today.  You may receive a patient satisfaction survey over the next few weeks. We would appreciate your feedback and comments so that we may continue to improve ourselves and the health of our patients.

## 2019-07-16 NOTE — Progress Notes (Signed)
[facility] [address] [place]. [place] [address] [phone]       STROKE FOLLOW UP NOTE  [name] [facility]. Date of Birth:  08-03-1955 Medical Record Number:  [id number]   Reason for Referral: stroke follow up    Chief complaint: Chief Complaint  Patient presents with  . Follow-up    Stroke follow up room in treatment room      HPI:  Mr. Hise is a 64 year old male who is being seen today, 07/16/2019, for cryptogenic right MCA stroke follow-up.  He has been doing well from a stroke standpoint with residual deficit of mild short-term memory impairment but does report ongoing improvement.  Loop recorder has not shown atrial fibrillation thus far.  Continues on aspirin 81 mg daily and atorvastatin for secondary stroke prevention without side effects.  He has not had recent lipid panel but does have appointment to see PCP in the near future and plans on obtaining lab work at that time.  Blood pressure today 147/85. Does not routinely monitor at home. No concerns at this time.     History Provided for reference purposes only Stroke admission 02/07/2019: Mr. PHU BLANKENBURG is a 64 y.o. male with history of Bell's Palsey  presented on 02/07/2019 with LUE weakness / numbness.   Stroke work-up revealed small shower of microembolic infarctions in the right MCA territory embolic as evidenced on MRI secondary to unknown source.  He did not receive IV t-PA due to late presentation (>4.5 hours from time of onset).  In addition to acute infarcts, MRI also showed evidence of multiple small chronic microhemorrhages within left parietal lobe.  CTA head/neck negative LVO with generalized atrial Tricity in the head/neck but minimal arthrosclerosis and no significant arterial stenosis.  2D echo normal EF without cardiac source of embolus identified.  LE venous Dopplers negative DVT.  TEE unremarkable without evidence of PFO.  Loop recorder placed to rule out cardiac  source and stroke etiology.  COVID-19 negative.  LDL 118.  A1c 5.5.  Recommended DAPT for 3 weeks and aspirin alone as previously not on antithrombotic.  HTN stable.  LDL 118 and initiated atorvastatin 40 mg daily.  Other stroke risk factors include advanced age, obesity and family history of TIAs but no personal history of TIA/stroke.  Discussion during admission regarding Remi Haggard trial with patient interested in additional information provided.  He was discharged home in stable condition without therapy needs.  Initial virtual visit 03/25/2019 JM: Mr. Sparger is a 64 year old male who is being seen today via virtual visit for hospital follow-up.  He has been doing well from a stroke standpoint without residual deficits or new stroke/TIA symptoms.  Wife does endorse mild short-term memory concerns potentially present prior to his stroke and unsure if worsened since stroke.  He has since returned back to work working for [employer] company remotely from his home.  Has returned to work without difficulty.  Loop recorder is not shown atrial fibrillation thus far.  He was evaluated for [place] trial but was deemed to not be a candidate.  Completed 3 weeks DAPT and continues on aspirin alone without bleeding or bruising.  Continues on atorvastatin 40 mg daily without myalgias.  Blood pressures not routinely monitor at home but recently establish care with PCP with satisfactory level.  No further concerns at this time.       ROS:   14 system review of systems performed and negative with exception of short-term memory  PMH:  Past Medical History:  Diagnosis Date  . Bell's palsy     PSH:  Past Surgical History:  Procedure Laterality Date  . BUBBLE STUDY  02/11/2019   Procedure: BUBBLE STUDY;  Surgeon: Jerline Pain, MD;  Location: [facility] ENDOSCOPY;  Service: Cardiovascular;;  . LOOP RECORDER INSERTION N/A 02/11/2019   Procedure: LOOP RECORDER INSERTION;  Surgeon: Deboraha Sprang, MD;  Location: [facility] LAB;  Service: Cardiovascular;  Laterality: N/A;  . TEE WITHOUT CARDIOVERSION N/A 02/11/2019   Procedure: TRANSESOPHAGEAL ECHOCARDIOGRAM (TEE);  Surgeon: Jerline Pain, MD;  Location: [facility] ENDOSCOPY;  Service: Cardiovascular;  Laterality: N/A;    Social History:  Social History   Socioeconomic History  . Marital status: Married    Spouse name: Not on file  . Number of children: Not on file  . Years of education: Not on file  . Highest education level: Not on file  Occupational History  . Not on file  Tobacco Use  . Smoking status: Never Smoker  . Smokeless tobacco: Never Used  Substance and Sexual Activity  . Alcohol use: Not Currently  . Drug use: Not on file  . Sexual activity: Not on file  Other Topics Concern  . Not on file  Social History Narrative  . Not on file   Social Determinants of Health   Financial Resource Strain:   . Difficulty of Paying Living Expenses:   Food Insecurity:   . Worried About Charity fundraiser in the Last Year:   . Arboriculturist in the Last Year:   Transportation Needs:   . Film/video editor (Medical):   Marland Kitchen Lack of Transportation (Non-Medical):   Physical Activity:   . Days of Exercise per Week:   . Minutes of Exercise per Session:   Stress:   . Feeling of Stress :   Social Connections:   . Frequency of Communication with Friends and Family:   . Frequency of Social Gatherings with Friends and Family:   . Attends Religious Services:   . Active Member of Clubs or Organizations:   . Attends Archivist Meetings:   Marland Kitchen Marital Status:   Intimate Partner Violence:   . Fear of Current or Ex-Partner:   . Emotionally Abused:   Marland Kitchen Physically Abused:   . Sexually Abused:     Family History:  Family History  Problem Relation Age of Onset  . Transient ischemic attack Mother   . Transient ischemic attack Father     Medications:   Current Outpatient Medications on File Prior to Visit  Medication Sig Dispense  Refill  . aspirin EC 81 MG EC tablet Take 1 tablet (81 mg total) by mouth daily. 30 tablet 3  . atorvastatin (LIPITOR) 40 MG tablet Take 1 tablet (40 mg total) by mouth daily at 6 PM. 90 tablet 2  . fluticasone (CUTIVATE) 0.05 % cream Apply topically 2 (two) times daily as needed. 30 g 0   No current facility-administered medications on file prior to visit.    Allergies:  No Known Allergies   Physical Exam  Today's Vitals   07/16/19 0833  BP: (!) 147/85  Pulse: (!) 53  Temp: (!) 97.1 F (36.2 C)  Weight: 211 lb 8 oz (95.9 kg)  Height: 6\' 1"  (1.854 m)   Body mass index is 27.9 kg/m.  General: well developed, well nourished,  pleasant middle-age Caucasian male, seated, in no evident distress Head: head normocephalic and atraumatic.   Neck: supple  with no carotid or supraclavicular bruits Cardiovascular: regular rate and rhythm, no murmurs Musculoskeletal: no deformity Skin:  no rash/petichiae Vascular:  Normal pulses all extremities   Neurologic Exam Mental Status: Awake and fully alert. Normal speech and language. Oriented to place and time. Recent memory mildly impaired and remote memory intact. Attention span, concentration and fund of knowledge appropriate during visit. Mood and affect appropriate. Recall 1/3.  Able to do serial additions with mild difficulty.  Animal naming 11 in 60 seconds. Cranial Nerves: Fundoscopic exam reveals sharp disc margins. Pupils equal, briskly reactive to light. Extraocular movements full without nystagmus. Visual fields full to confrontation. Hearing intact. Facial sensation intact.  Mild right lower facial paralysis -chronic from Bell's palsy Motor: Normal bulk and tone. Normal strength in all tested extremity muscles. Sensory.: intact to touch , pinprick , position and vibratory sensation.  Coordination: Rapid alternating movements normal in all extremities. Finger-to-nose and heel-to-shin performed accurately bilaterally. Gait and Station:  Arises from chair without difficulty. Stance is normal. Gait demonstrates normal stride length and balance without use of assistive device Reflexes: 1+ and symmetric. Toes downgoing.         ASSESSMENT: Jaelynn EDWARD ZANNIE LOPARDO. is a 64 y.o. year old male presented with LUE weakness/numbness on 02/07/2019 with stroke work-up revealing small shower microembolic infarctions within the right MCA territory embolic pattern secondary to unknown source with loop recorder placed. Vascular risk factors include HTN, HLD, Bell's palsy and family history of stroke.  Recovered well from a stroke standpoint with residual mild short-term memory concerns with ongoing improvement    PLAN:  1. Right MCA strokes: Continue aspirin 81 mg daily  and atorvastatin for secondary stroke prevention. Maintain strict control of hypertension with blood pressure goal below 130/90, diabetes with hemoglobin A1c goal below 6.5% and cholesterol with LDL cholesterol (bad cholesterol) goal below 70 mg/dL.  I also advised the patient to eat a healthy diet with plenty of whole grains, cereals, fruits and vegetables, exercise regularly with at least 30 minutes of continuous activity daily and maintain ideal body weight.  Continue to monitor loop recorder for potential atrial fibrillation.  2. HTN: Advised to continue current treatment regimen.  Recommended monitoring at home to ensure satisfactory levels and ongoing follow-up with PCP 3. HLD: Advised to continue current treatment regimen along with continued follow-up with PCP for future prescribing and monitoring of lipid panel -request lipid panel be obtained at follow-up visit   Follow up in 6 months or call earlier if needed   I spent 23 minutes of face-to-face and non-face-to-face time with patient.  This included previsit chart review, lab review, study review, order entry, electronic health record documentation, patient education     Frann Rider, [facility]  [facility] [address] [place], [place] [address]  Phone [phone] Fax [phone] Note: This document was prepared with digital dictation and possible smart phrase technology. Any transcriptional errors that result from this process are unintentional.

## 2019-07-21 LAB — CUP PACEART REMOTE DEVICE CHECK
Date Time Interrogation Session: 20210327230417
Implantable Pulse Generator Implant Date: 20201019

## 2019-07-22 ENCOUNTER — Ambulatory Visit (INDEPENDENT_AMBULATORY_CARE_PROVIDER_SITE_OTHER): Payer: BC Managed Care – PPO | Admitting: *Deleted

## 2019-07-22 ENCOUNTER — Encounter: Payer: Self-pay | Admitting: Adult Health

## 2019-07-22 DIAGNOSIS — I639 Cerebral infarction, unspecified: Secondary | ICD-10-CM | POA: Diagnosis not present

## 2019-07-22 NOTE — Progress Notes (Signed)
ILR Remote 

## 2019-07-23 ENCOUNTER — Telehealth: Payer: Self-pay | Admitting: Family Medicine

## 2019-07-23 NOTE — Telephone Encounter (Signed)
Pt would like to know if Dr. Martinique will take his son as a new patient, but would need to have him seen sooner than next available new patient appt. He is having back pain.

## 2019-07-24 ENCOUNTER — Encounter: Payer: Self-pay | Admitting: Family Medicine

## 2019-07-24 NOTE — Telephone Encounter (Signed)
Please advise 

## 2019-07-24 NOTE — Telephone Encounter (Signed)
Appt for acute visit can be arranged and after visit we can decide about establishing care. Thanks, BJ

## 2019-07-25 NOTE — Telephone Encounter (Signed)
Mr. Rossy, called back and stated, that his son is seeing another physician today and will call back if his son needs an appt with Dr. Martinique.

## 2019-07-25 NOTE — Telephone Encounter (Signed)
LMVM for the patient to contact the opffice to give [place] his sons name and date of birth for [place] to schedule his son for an acute visit and then schedule a new patient appointment.

## 2019-07-26 NOTE — Progress Notes (Signed)
I agree with the above plan 

## 2019-08-02 ENCOUNTER — Other Ambulatory Visit: Payer: Self-pay

## 2019-08-05 ENCOUNTER — Other Ambulatory Visit: Payer: Self-pay

## 2019-08-05 ENCOUNTER — Encounter: Payer: Self-pay | Admitting: Family Medicine

## 2019-08-05 ENCOUNTER — Ambulatory Visit (INDEPENDENT_AMBULATORY_CARE_PROVIDER_SITE_OTHER): Payer: BC Managed Care – PPO | Admitting: Family Medicine

## 2019-08-05 VITALS — BP 120/82 | HR 60 | Temp 97.8°F | Resp 12 | Ht 73.0 in | Wt 209.5 lb

## 2019-08-05 DIAGNOSIS — R030 Elevated blood-pressure reading, without diagnosis of hypertension: Secondary | ICD-10-CM | POA: Diagnosis not present

## 2019-08-05 DIAGNOSIS — E785 Hyperlipidemia, unspecified: Secondary | ICD-10-CM | POA: Diagnosis not present

## 2019-08-05 DIAGNOSIS — E7800 Pure hypercholesterolemia, unspecified: Secondary | ICD-10-CM

## 2019-08-05 DIAGNOSIS — I63411 Cerebral infarction due to embolism of right middle cerebral artery: Secondary | ICD-10-CM | POA: Diagnosis not present

## 2019-08-05 LAB — LIPID PANEL
Cholesterol: 132 mg/dL (ref 0–200)
HDL: 43.4 mg/dL (ref >39.00)
LDL Cholesterol: 68 mg/dL (ref 0–99)
NonHDL: 88.94
Total CHOL/HDL Ratio: 3
Triglycerides: 106 mg/dL (ref 0.0–149.0)
VLDL: 21.2 mg/dL (ref 0.0–40.0)

## 2019-08-05 NOTE — Progress Notes (Signed)
HPI:   Joshua Mayer. is a 64 y.o. male, who is here today for 6 months follow up.   He was last seen on 03/08/2019. Since his last visit he has follow-up with neurologist. No new problems.  Hyperlipidemia: Currently he is on atorvastatin 40 mg daily. Also following low-fat diet. He has tolerated medication well.  Component     Latest Ref Rng & Units 02/08/2019  Cholesterol     0 - 200 mg/dL 183  Triglycerides     0.0 - 149.0 mg/dL 98  HDL Cholesterol     >39.00 mg/dL 45  Total CHOL/HDL Ratio      4.1  VLDL     0.0 - 40.0 mg/dL 20  LDL (calc)     0 - 99 mg/dL 118 (H)   CVA, thought to be cryptogenic stroke. He reports no residual deficit.  Cardiac monitor has not shown signs of arrhythmia or other abnormality. He has EKG tracing in his phone. Marland Kitchen He feels "great." Brain MRI done on 02/09/2019: Fontweight acute infarction in the lateral right temporal lobe and suggest other tiny acute infarctions along the surface of the of the insula and at the right temporoparietal junction.  This would indicate that we are dealing with a small shower of microembolic infarction in the right MCA territory.  He is on Aspirin 81 mg daily.  BP was mildly elevated during last neurology visit, 147/85. Negative for unusual headache, visual changes, CP, palpitations, dyspnea, or edema.   Lab Results  Component Value Date   CREATININE 0.99 02/08/2019   BUN 13 02/08/2019   NA 140 02/08/2019   K 3.9 02/08/2019   CL 105 02/08/2019   CO2 26 02/08/2019    Review of Systems  Constitutional: Negative for activity change, appetite change, fatigue and fever.  HENT: Negative for mouth sores, nosebleeds and sore throat.   Respiratory: Negative for cough and wheezing.   Gastrointestinal: Negative for abdominal pain, nausea and vomiting.  Genitourinary: Negative for decreased urine volume, dysuria and hematuria.  Musculoskeletal: Negative for gait problem and myalgias.    Neurological: Negative for syncope, facial asymmetry and weakness.  Rest of ROS, see pertinent positives sand negatives in HPI  Current Outpatient Medications on File Prior to Visit  Medication Sig Dispense Refill  . aspirin EC 81 MG EC tablet Take 1 tablet (81 mg total) by mouth daily. 30 tablet 3  . atorvastatin (LIPITOR) 40 MG tablet Take 1 tablet (40 mg total) by mouth daily at 6 PM. 90 tablet 2  . fluticasone (CUTIVATE) 0.05 % cream Apply topically 2 (two) times daily as needed. 30 g 0   No current facility-administered medications on file prior to visit.    Past Medical History:  Diagnosis Date  . Bell's palsy    No Known Allergies  Social History   Socioeconomic History  . Marital status: Married    Spouse name: Not on file  . Number of children: Not on file  . Years of education: Not on file  . Highest education level: Not on file  Occupational History  . Not on file  Tobacco Use  . Smoking status: Never Smoker  . Smokeless tobacco: Never Used  Substance and Sexual Activity  . Alcohol use: Not Currently  . Drug use: Not on file  . Sexual activity: Not on file  Other Topics Concern  . Not on file  Social History Narrative  . Not on file  Social Determinants of Health   Financial Resource Strain:   . Difficulty of Paying Living Expenses:   Food Insecurity:   . Worried About Charity fundraiser in the Last Year:   . Arboriculturist in the Last Year:   Transportation Needs:   . Film/video editor (Medical):   Marland Kitchen Lack of Transportation (Non-Medical):   Physical Activity:   . Days of Exercise per Week:   . Minutes of Exercise per Session:   Stress:   . Feeling of Stress :   Social Connections:   . Frequency of Communication with Friends and Family:   . Frequency of Social Gatherings with Friends and Family:   . Attends Religious Services:   . Active Member of Clubs or Organizations:   . Attends Archivist Meetings:   Marland Kitchen Marital Status:      Vitals:   08/05/19 0757  BP: 120/82  Pulse: 60  Resp: 12  Temp: 97.8 F (36.6 C)  SpO2: 96%   Body mass index is 27.64 kg/m.  Physical Exam  Nursing note reviewed. Constitutional: He is oriented to person, place, and time. He appears well-developed. No distress.  HENT:  Head: Normocephalic and atraumatic.  Mouth/Throat: Oropharynx is clear and moist and mucous membranes are normal.  Eyes: Pupils are equal, round, and reactive to light. Conjunctivae are normal.  Cardiovascular: Normal rate and regular rhythm.  No murmur heard. Pulses:      Dorsalis pedis pulses are 2+ on the right side and 2+ on the left side.  Respiratory: Effort normal and breath sounds normal. No respiratory distress.  GI: Soft. He exhibits no mass. There is no hepatomegaly. There is no abdominal tenderness.  Musculoskeletal:        General: No edema.  Lymphadenopathy:    He has no cervical adenopathy.  Neurological: He is alert and oriented to person, place, and time. He has normal strength. No cranial nerve deficit. Gait normal.  Skin: Skin is warm. No rash noted. No erythema.  Psychiatric: He has a normal mood and affect.  Well groomed, good eye contact.   ASSESSMENT AND PLAN:   [name]. was seen today for 6 months follow-up.  Orders Placed This Encounter  Procedures  . Lipid panel   Lab Results  Component Value Date   CHOL 132 08/05/2019   HDL 43.40 08/05/2019   LDLCALC 68 08/05/2019   LDLDIRECT 148.2 03/22/2006   TRIG 106.0 08/05/2019   CHOLHDL 3 08/05/2019    Hyperlipidemia, unspecified hyperlipidemia type Continue atorvastatin 40 mg daily. LDL goal < 100. Further recommendation will be given according to lipid panel results.  Elevated blood pressure reading BP today adequately controlled. If possible check BP at home. Low salt diet.  Cerebrovascular accident (CVA) due to embolism of right middle cerebral artery (HCC) No significant residual  deficit. Continue atorvastatin 40 mg daily and Aspirin 81 mg daily. Following with neurologist every 6 months.   Return in about 8 months (around 04/15/2020) for CPE.   Bora Broner G. Martinique, MD  [facility]. [place] office.  A few things to remember from today's visit:  No changes today.  Please be sure medication list is accurate. If a new problem present, please set up appointment sooner than planned today

## 2019-08-05 NOTE — Patient Instructions (Signed)
A few things to remember from today's visit:  No changes today.  Please be sure medication list is accurate. If a new problem present, please set up appointment sooner than planned today.

## 2019-08-06 ENCOUNTER — Encounter: Payer: Self-pay | Admitting: Family Medicine

## 2019-08-22 LAB — CUP PACEART REMOTE DEVICE CHECK
Date Time Interrogation Session: 20210428230549
Implantable Pulse Generator Implant Date: 20201019

## 2019-08-26 ENCOUNTER — Ambulatory Visit (INDEPENDENT_AMBULATORY_CARE_PROVIDER_SITE_OTHER): Payer: BC Managed Care – PPO | Admitting: *Deleted

## 2019-08-26 DIAGNOSIS — I639 Cerebral infarction, unspecified: Secondary | ICD-10-CM | POA: Diagnosis not present

## 2019-08-27 NOTE — Progress Notes (Signed)
Carelink Summary Report / Loop Recorder 

## 2019-09-24 ENCOUNTER — Ambulatory Visit (INDEPENDENT_AMBULATORY_CARE_PROVIDER_SITE_OTHER): Payer: BC Managed Care – PPO | Admitting: *Deleted

## 2019-09-24 DIAGNOSIS — I639 Cerebral infarction, unspecified: Secondary | ICD-10-CM

## 2019-09-24 LAB — CUP PACEART REMOTE DEVICE CHECK
Date Time Interrogation Session: 20210529230041
Implantable Pulse Generator Implant Date: 20201019

## 2019-09-25 NOTE — Progress Notes (Signed)
Carelink Summary Report / Loop Recorder 

## 2019-09-27 NOTE — Addendum Note (Signed)
Addended by: Douglass Rivers D on: 09/27/2019 09:31 AM   Modules accepted: Level of Service

## 2019-10-25 ENCOUNTER — Ambulatory Visit (INDEPENDENT_AMBULATORY_CARE_PROVIDER_SITE_OTHER): Payer: BC Managed Care – PPO | Admitting: *Deleted

## 2019-10-25 DIAGNOSIS — I639 Cerebral infarction, unspecified: Secondary | ICD-10-CM

## 2019-10-25 LAB — CUP PACEART REMOTE DEVICE CHECK
Date Time Interrogation Session: 20210701230526
Implantable Pulse Generator Implant Date: 20201019

## 2019-10-27 ENCOUNTER — Other Ambulatory Visit: Payer: Self-pay | Admitting: Family Medicine

## 2019-10-29 NOTE — Progress Notes (Signed)
Carelink Summary Report / Loop Recorder 

## 2019-11-30 LAB — CUP PACEART REMOTE DEVICE CHECK
Date Time Interrogation Session: 20210803230423
Implantable Pulse Generator Implant Date: 20201019

## 2019-12-01 LAB — CUP PACEART REMOTE DEVICE CHECK
Date Time Interrogation Session: 20210808000221
Implantable Pulse Generator Implant Date: 20201019

## 2019-12-02 ENCOUNTER — Ambulatory Visit (INDEPENDENT_AMBULATORY_CARE_PROVIDER_SITE_OTHER): Payer: BC Managed Care – PPO | Admitting: *Deleted

## 2019-12-02 DIAGNOSIS — I639 Cerebral infarction, unspecified: Secondary | ICD-10-CM

## 2019-12-03 ENCOUNTER — Encounter: Payer: Self-pay | Admitting: Adult Health

## 2019-12-03 ENCOUNTER — Telehealth: Payer: Self-pay

## 2019-12-03 NOTE — Progress Notes (Signed)
Carelink Summary Report / Loop Recorder 

## 2019-12-03 NOTE — Telephone Encounter (Signed)
Pt spouse (DPR on file) concerned by recent resulted posted in my chart on 8/8:  event report - AF episode - 2 min episode is SR with noise.    Explained that we received report for ILR that computer algorithm recognized as Afib.  Upon trained inspection of strip the Rhythm was noted to be SR with noise or interference.

## 2020-01-01 LAB — CUP PACEART REMOTE DEVICE CHECK
Date Time Interrogation Session: 20210905230346
Implantable Pulse Generator Implant Date: 20201019

## 2020-01-06 ENCOUNTER — Ambulatory Visit (INDEPENDENT_AMBULATORY_CARE_PROVIDER_SITE_OTHER): Payer: BC Managed Care – PPO | Admitting: *Deleted

## 2020-01-06 DIAGNOSIS — I639 Cerebral infarction, unspecified: Secondary | ICD-10-CM | POA: Diagnosis not present

## 2020-01-08 NOTE — Progress Notes (Signed)
Carelink Summary Report / Loop Recorder 

## 2020-01-14 ENCOUNTER — Telehealth: Payer: Self-pay

## 2020-01-14 NOTE — Telephone Encounter (Signed)
Repeated ILR false AF alerts received in [place]. Alerts reviewed appear to have noise. P waves are present. Reprogrammed device to "less sensitive" per protocol. Continue to monitor. LW

## 2020-01-16 ENCOUNTER — Ambulatory Visit: Payer: BC Managed Care – PPO | Admitting: Adult Health

## 2020-01-23 ENCOUNTER — Encounter: Payer: Self-pay | Admitting: Adult Health

## 2020-01-23 ENCOUNTER — Other Ambulatory Visit: Payer: Self-pay

## 2020-01-23 ENCOUNTER — Ambulatory Visit (INDEPENDENT_AMBULATORY_CARE_PROVIDER_SITE_OTHER): Payer: BC Managed Care – PPO | Admitting: Adult Health

## 2020-01-23 VITALS — BP 136/86 | HR 52 | Ht 73.0 in | Wt 209.0 lb

## 2020-01-23 DIAGNOSIS — G4700 Insomnia, unspecified: Secondary | ICD-10-CM | POA: Diagnosis not present

## 2020-01-23 DIAGNOSIS — I10 Essential (primary) hypertension: Secondary | ICD-10-CM | POA: Diagnosis not present

## 2020-01-23 DIAGNOSIS — I69319 Unspecified symptoms and signs involving cognitive functions following cerebral infarction: Secondary | ICD-10-CM

## 2020-01-23 DIAGNOSIS — E785 Hyperlipidemia, unspecified: Secondary | ICD-10-CM

## 2020-01-23 DIAGNOSIS — I639 Cerebral infarction, unspecified: Secondary | ICD-10-CM | POA: Diagnosis not present

## 2020-01-23 NOTE — Progress Notes (Signed)
I agree with the above plan 

## 2020-01-23 NOTE — Patient Instructions (Signed)
Continue aspirin 81 mg daily  and lipitor  for secondary stroke prevention  Loop recorder will continue to be monitored  Consider being evaluated for sleep apnea  Continue to follow up with PCP regarding cholesterol and blood pressure management  Maintain strict control of hypertension with blood pressure goal below 130/90 and cholesterol with LDL cholesterol (bad cholesterol) goal below 70 mg/dL.         Thank you for coming to see [place] at [facility]. I hope we have been able to provide you high quality care today.  You may receive a patient satisfaction survey over the next few weeks. We would appreciate your feedback and comments so that we may continue to improve ourselves and the health of our patients.    Sleep Apnea Sleep apnea is a condition in which breathing pauses or becomes shallow during sleep. Episodes of sleep apnea usually last 10 seconds or longer, and they may occur as many as 20 times an hour. Sleep apnea disrupts your sleep and keeps your body from getting the rest that it needs. This condition can increase your risk of certain health problems, including:  Heart attack.  Stroke.  Obesity.  Diabetes.  Heart failure.  Irregular heartbeat. What are the causes? There are three kinds of sleep apnea:  Obstructive sleep apnea. This kind is caused by a blocked or collapsed airway.  Central sleep apnea. This kind happens when the part of the brain that controls breathing does not send the correct signals to the muscles that control breathing.  Mixed sleep apnea. This is a combination of obstructive and central sleep apnea. The most common cause of this condition is a collapsed or blocked airway. An airway can collapse or become blocked if:  Your throat muscles are abnormally relaxed.  Your tongue and tonsils are larger than normal.  You are overweight.  Your airway is smaller than normal. What increases the risk? You are more likely to  develop this condition if you:  Are overweight.  Smoke.  Have a smaller than normal airway.  Are elderly.  Are male.  Drink alcohol.  Take sedatives or tranquilizers.  Have a family history of sleep apnea. What are the signs or symptoms? Symptoms of this condition include:  Trouble staying asleep.  Daytime sleepiness and tiredness.  Irritability.  Loud snoring.  Morning headaches.  Trouble concentrating.  Forgetfulness.  Decreased interest in sex.  Unexplained sleepiness.  Mood swings.  Personality changes.  Feelings of depression.  Waking up often during the night to urinate.  Dry mouth.  Sore throat. How is this diagnosed? This condition may be diagnosed with:  A medical history.  A physical exam.  A series of tests that are done while you are sleeping (sleep study). These tests are usually done in a sleep lab, but they may also be done at home. How is this treated? Treatment for this condition aims to restore normal breathing and to ease symptoms during sleep. It may involve managing health issues that can affect breathing, such as high blood pressure or obesity. Treatment may include:  Sleeping on your side.  Using a decongestant if you have nasal congestion.  Avoiding the use of depressants, including alcohol, sedatives, and narcotics.  Losing weight if you are overweight.  Making changes to your diet.  Quitting smoking.  Using a device to open your airway while you sleep, such as: ? An oral appliance. This is a custom-made mouthpiece that shifts your lower jaw forward. ?  A continuous positive airway pressure (CPAP) device. This device blows air through a mask when you breathe out (exhale). ? A nasal expiratory positive airway pressure (EPAP) device. This device has valves that you put into each nostril. ? A bi-level positive airway pressure (BPAP) device. This device blows air through a mask when you breathe in (inhale) and breathe out  (exhale).  Having surgery if other treatments do not work. During surgery, excess tissue is removed to create a wider airway. It is important to get treatment for sleep apnea. Without treatment, this condition can lead to:  High blood pressure.  Coronary artery disease.  In men, an inability to achieve or maintain an erection (impotence).  Reduced thinking abilities. Follow these instructions at home: Lifestyle  Make any lifestyle changes that your health care provider recommends.  Eat a healthy, well-balanced diet.  Take steps to lose weight if you are overweight.  Avoid using depressants, including alcohol, sedatives, and narcotics.  Do not use any products that contain nicotine or tobacco, such as cigarettes, e-cigarettes, and chewing tobacco. If you need help quitting, ask your health care provider. General instructions  Take over-the-counter and prescription medicines only as told by your health care provider.  If you were given a device to open your airway while you sleep, use it only as told by your health care provider.  If you are having surgery, make sure to tell your health care provider you have sleep apnea. You may need to bring your device with you.  Keep all follow-up visits as told by your health care provider. This is important. Contact a health care provider if:  The device that you received to open your airway during sleep is uncomfortable or does not seem to be working.  Your symptoms do not improve.  Your symptoms get worse. Get help right away if:  You develop: ? Chest pain. ? Shortness of breath. ? Discomfort in your back, arms, or stomach.  You have: ? Trouble speaking. ? Weakness on one side of your body. ? Drooping in your face. These symptoms may represent a serious problem that is an emergency. Do not wait to see if the symptoms will go away. Get medical help right away. Call your local emergency services (911 in the [place] Do not drive  yourself to the hospital. Summary  Sleep apnea is a condition in which breathing pauses or becomes shallow during sleep.  The most common cause is a collapsed or blocked airway.  The goal of treatment is to restore normal breathing and to ease symptoms during sleep. This information is not intended to replace advice given to you by your health care provider. Make sure you discuss any questions you have with your health care provider. Document Revised: 09/26/2018 Document Reviewed: 12/05/2017 Elsevier Patient Education  [employer].

## 2020-01-23 NOTE — Progress Notes (Signed)
[facility] [address] [place]. [place] [address] [phone]       STROKE FOLLOW UP NOTE  [name] [facility]. Date of Birth:  1956-03-28 Medical Record Number:  [id number]   Reason for Referral: stroke follow up    Chief complaint: Chief Complaint  Patient presents with  . Follow-up    rm 9  . Cerebrovascular Accident    Pt said hr has nonew sx. He said he does memory issues.      HPI:  Today, 01/23/2020, Joshua Mayer returns for 61-month stroke follow-up accompanied by his wife.  Stable from a stroke standpoint with residual mild short-term memory difficulties greater with increased anxiety or fatigue.  This has been stable without worsening.  Denies new or worsening stroke/TIA symptoms.  Continues on aspirin 81 mg daily and atorvastatin 40 mg daily for secondary stroke prevention without side effects.  Blood pressure today 136/86.  Does not routinely monitor at home. Loop recorder has not shown atrial fibrillation thus far. Wife does report patient having difficulty with waking up in the middle the night, daytime fatigue and snoring.  No further concerns at this time.    History provided for reference purposes only Update 07/16/2019 JM: Joshua Mayer is a 64 year old male who is being seen today, 07/16/2019, for cryptogenic right MCA stroke follow-up.  He has been doing well from a stroke standpoint with residual deficit of mild short-term memory impairment but does report ongoing improvement.  Loop recorder has not shown atrial fibrillation thus far.  Continues on aspirin 81 mg daily and atorvastatin for secondary stroke prevention without side effects.  He has not had recent lipid panel but does have appointment to see PCP in the near future and plans on obtaining lab work at that time.  Blood pressure today 147/85. Does not routinely monitor at home. No concerns at this time.  Initial virtual visit 03/25/2019 JM: Joshua Mayer is a 64 year old male who is  being seen today via virtual visit for hospital follow-up.  He has been doing well from a stroke standpoint without residual deficits or new stroke/TIA symptoms.  Wife does endorse mild short-term memory concerns potentially present prior to his stroke and unsure if worsened since stroke.  He has since returned back to work working for [employer] company remotely from his home.  Has returned to work without difficulty.  Loop recorder is not shown atrial fibrillation thus far.  He was evaluated for [place] trial but was deemed to not be a candidate.  Completed 3 weeks DAPT and continues on aspirin alone without bleeding or bruising.  Continues on atorvastatin 40 mg daily without myalgias.  Blood pressures not routinely monitor at home but recently establish care with PCP with satisfactory level.  No further concerns at this time.  Stroke admission 02/07/2019: Joshua Mayer is a 64 y.o. male with history of Bell's Palsey  presented on 02/07/2019 with LUE weakness / numbness.   Stroke work-up revealed small shower of microembolic infarctions in the right MCA territory embolic as evidenced on MRI secondary to unknown source.  He did not receive IV t-PA due to late presentation (>4.5 hours from time of onset).  In addition to acute infarcts, MRI also showed evidence of multiple small chronic microhemorrhages within left parietal lobe.  CTA head/neck negative LVO with generalized atrial Tricity in the head/neck but minimal arthrosclerosis and no significant arterial stenosis.  2D echo normal EF without cardiac source of embolus identified.  LE  venous Dopplers negative DVT.  TEE unremarkable without evidence of PFO.  Loop recorder placed to rule out cardiac source and stroke etiology.  COVID-19 negative.  LDL 118.  A1c 5.5.  Recommended DAPT for 3 weeks and aspirin alone as previously not on antithrombotic.  HTN stable.  LDL 118 and initiated atorvastatin 40 mg daily.  Other stroke risk factors include  advanced age, obesity and family history of TIAs but no personal history of TIA/stroke.  Discussion during admission regarding Joshua Mayer trial with patient interested in additional information provided.  He was discharged home in stable condition without therapy needs.       ROS:   14 system review of systems performed and negative with exception of short-term memory impairment, insomnia, daytime fatigue and snoring  PMH:  Past Medical History:  Diagnosis Date  . Bell's palsy     PSH:  Past Surgical History:  Procedure Laterality Date  . BUBBLE STUDY  02/11/2019   Procedure: BUBBLE STUDY;  Surgeon: Jerline Pain, MD;  Location: [facility] ENDOSCOPY;  Service: Cardiovascular;;  . LOOP RECORDER INSERTION N/A 02/11/2019   Procedure: LOOP RECORDER INSERTION;  Surgeon: Deboraha Sprang, MD;  Location: [place] CV LAB;  Service: Cardiovascular;  Laterality: N/A;  . TEE WITHOUT CARDIOVERSION N/A 02/11/2019   Procedure: TRANSESOPHAGEAL ECHOCARDIOGRAM (TEE);  Surgeon: Jerline Pain, MD;  Location: [facility] ENDOSCOPY;  Service: Cardiovascular;  Laterality: N/A;    Social History:  Social History   Socioeconomic History  . Marital status: Married    Spouse name: Not on file  . Number of children: Not on file  . Years of education: Not on file  . Highest education level: Not on file  Occupational History  . Not on file  Tobacco Use  . Smoking status: Never Smoker  . Smokeless tobacco: Never Used  Substance and Sexual Activity  . Alcohol use: Not Currently  . Drug use: Not on file  . Sexual activity: Not on file  Other Topics Concern  . Not on file  Social History Narrative  . Not on file   Social Determinants of Health   Financial Resource Strain:   . Difficulty of Paying Living Expenses: Not on file  Food Insecurity:   . Worried About Charity fundraiser in the Last Year: Not on file  . Ran Out of Food in the Last Year: Not on file  Transportation Needs:   . Lack of Transportation  (Medical): Not on file  . Lack of Transportation (Non-Medical): Not on file  Physical Activity:   . Days of Exercise per Week: Not on file  . Minutes of Exercise per Session: Not on file  Stress:   . Feeling of Stress : Not on file  Social Connections:   . Frequency of Communication with Friends and Family: Not on file  . Frequency of Social Gatherings with Friends and Family: Not on file  . Attends Religious Services: Not on file  . Active Member of Clubs or Organizations: Not on file  . Attends Archivist Meetings: Not on file  . Marital Status: Not on file  Intimate Partner Violence:   . Fear of Current or Ex-Partner: Not on file  . Emotionally Abused: Not on file  . Physically Abused: Not on file  . Sexually Abused: Not on file    Family History:  Family History  Problem Relation Age of Onset  . Transient ischemic attack Mother   . Transient ischemic attack Father  Medications:   Current Outpatient Medications on File Prior to Visit  Medication Sig Dispense Refill  . aspirin EC 81 MG EC tablet Take 1 tablet (81 mg total) by mouth daily. 30 tablet 3  . atorvastatin (LIPITOR) 40 MG tablet Take 1 tablet (40 mg total) by mouth daily at 6 PM. 90 tablet 2  . fluticasone (CUTIVATE) 0.05 % cream APPLY TOPICALLY 2 (TWO) TIMES DAILY AS NEEDED. 30 g 0   No current facility-administered medications on file prior to visit.    Allergies:  No Known Allergies   Physical Exam  Today's Vitals   01/23/20 0812  BP: 136/86  Pulse: (!) 52  Weight: 209 lb (94.8 kg)  Height: 6\' 1"  (1.854 m)   Body mass index is 27.57 kg/m.  General: well developed, well nourished,  pleasant middle-age Caucasian male, seated, in no evident distress Head: head normocephalic and atraumatic.   Neck: supple with no carotid or supraclavicular bruits Cardiovascular: regular rate and rhythm, no murmurs Musculoskeletal: no deformity Skin:  no rash/petichiae Vascular:  Normal pulses all  extremities   Neurologic Exam Mental Status: Awake and fully alert. Normal speech and language. Oriented to place and time. Recent memory mildly impaired and remote memory intact. Attention span, concentration and fund of knowledge appropriate during visit. Mood and affect appropriate.  Cranial Nerves: Pupils equal, briskly reactive to light. Extraocular movements full without nystagmus. Visual fields full to confrontation. Hearing intact. Facial sensation intact.  Mild right lower facial paralysis -chronic from Bell's palsy Motor: Normal bulk and tone. Normal strength in all tested extremity muscles. Sensory.: intact to touch , pinprick , position and vibratory sensation.  Coordination: Rapid alternating movements normal in all extremities. Finger-to-nose and heel-to-shin performed accurately bilaterally. Gait and Station: Arises from chair without difficulty. Stance is normal. Gait demonstrates normal stride length and balance without use of assistive device Reflexes: 1+ and symmetric. Toes downgoing.         ASSESSMENT/PLAN: ZAKI GERTSCH. is a 64 y.o. year old male presented with LUE weakness/numbness on 02/07/2019 with stroke work-up revealing small shower microembolic infarctions within the right MCA territory embolic pattern secondary to unknown source with loop recorder placed. Vascular risk factors include HTN, HLD, Bell's palsy and family history of stroke.     1. Right MCA strokes:  a. Residual deficits mild short-term memory concerns b. Continue aspirin 81 mg daily  and atorvastatin for secondary stroke prevention.  c. Recorder is not shown atrial fibrillation thus far -monitored by cardiology d. Discussed secondary stroke prevention measures and importance of close PCP follow-up for aggressive stroke risk factor management 2. HTN: BP goal<130/90.  Stable.  Managed by PCP. 3. HLD: LDL goal<70.  Recent LDL 68 (07/2019).  Continue atorvastatin 40 mg daily.  Managed and  prescribed by PCP. 4. Insomnia: discussed being evaluated for possible sleep apnea but difficulty with insomnia, occasional snoring and daytime fatigue.  Brother has history of sleep apnea.  He declines interest in evaluation at this time but will call office if interested in pursuing.  Educated on increased risk of stroke and cardiovascular disease with untreated sleep apnea and patient and wife verbalized understanding.  Advised to follow-up with PCP in regards to insomnia concerns.  Also discussed importance of adequate sleep hygiene.   Overall stable from stroke standpoint and recommend follow-up on an as-needed basis  I spent 30 minutes of face-to-face and non-face-to-face time with patient and wife.  This included previsit chart review, lab review, study review,  order entry, electronic health record documentation, patient education and discussion regarding prior stroke, residual deficits, importance of managing stroke risk factors, insomnia with possible sleep apnea and answered all her questions to patient and wife satisfaction   Frann Rider, AGNP-BC  [facility] [address] [place], [place] [address]  Phone [phone] Fax [phone] Note: This document was prepared with digital dictation and possible smart phrase technology. Any transcriptional errors that result from this process are unintentional.

## 2020-02-01 LAB — CUP PACEART REMOTE DEVICE CHECK
Date Time Interrogation Session: 20211008230133
Implantable Pulse Generator Implant Date: 20201019

## 2020-02-10 ENCOUNTER — Ambulatory Visit (INDEPENDENT_AMBULATORY_CARE_PROVIDER_SITE_OTHER): Payer: BC Managed Care – PPO

## 2020-02-10 DIAGNOSIS — I639 Cerebral infarction, unspecified: Secondary | ICD-10-CM | POA: Diagnosis not present

## 2020-02-14 NOTE — Progress Notes (Signed)
Carelink Summary Report / Loop Recorder 

## 2020-03-16 ENCOUNTER — Ambulatory Visit (INDEPENDENT_AMBULATORY_CARE_PROVIDER_SITE_OTHER): Payer: BC Managed Care – PPO

## 2020-03-16 DIAGNOSIS — I639 Cerebral infarction, unspecified: Secondary | ICD-10-CM | POA: Diagnosis not present

## 2020-03-16 LAB — CUP PACEART REMOTE DEVICE CHECK
Date Time Interrogation Session: 20211121230654
Implantable Pulse Generator Implant Date: 20201019

## 2020-03-18 NOTE — Progress Notes (Signed)
Carelink Summary Report / Loop Recorder 

## 2020-04-06 ENCOUNTER — Other Ambulatory Visit: Payer: Self-pay

## 2020-04-06 ENCOUNTER — Encounter: Payer: Self-pay | Admitting: Family Medicine

## 2020-04-06 ENCOUNTER — Ambulatory Visit (INDEPENDENT_AMBULATORY_CARE_PROVIDER_SITE_OTHER): Payer: BC Managed Care – PPO | Admitting: Family Medicine

## 2020-04-06 VITALS — BP 122/82 | HR 60 | Temp 98.1°F | Resp 16 | Ht 73.0 in | Wt 214.8 lb

## 2020-04-06 DIAGNOSIS — Z13228 Encounter for screening for other metabolic disorders: Secondary | ICD-10-CM | POA: Diagnosis not present

## 2020-04-06 DIAGNOSIS — Z125 Encounter for screening for malignant neoplasm of prostate: Secondary | ICD-10-CM | POA: Diagnosis not present

## 2020-04-06 DIAGNOSIS — Z130 Encounter for screening for diseases of the blood and blood-forming organs and certain disorders involving the immune mechanism: Secondary | ICD-10-CM

## 2020-04-06 DIAGNOSIS — Z1211 Encounter for screening for malignant neoplasm of colon: Secondary | ICD-10-CM

## 2020-04-06 DIAGNOSIS — Z1329 Encounter for screening for other suspected endocrine disorder: Secondary | ICD-10-CM | POA: Diagnosis not present

## 2020-04-06 DIAGNOSIS — Z0000 Encounter for general adult medical examination without abnormal findings: Secondary | ICD-10-CM

## 2020-04-06 DIAGNOSIS — Z23 Encounter for immunization: Secondary | ICD-10-CM

## 2020-04-06 DIAGNOSIS — E785 Hyperlipidemia, unspecified: Secondary | ICD-10-CM | POA: Diagnosis not present

## 2020-04-06 NOTE — Progress Notes (Signed)
HPI:  [name]. is a 64 y.o.male here today for his routine physical examination.  Last CPE: 03/05/19 He lives with his wife and his 2 sons (74 and 65 years old).  Regular exercise 3 or more times per week: Yes, he walks a few times per week. Following a healthy diet: Yes  Chronic medical problems: Cryptogenic stroke, hyperlipidemia, past history of Bell's palsy.  Immunization History  Administered Date(s) Administered  . Influenza,inj,Quad PF,6+ Mos 02/26/2019  . Moderna Sars-Covid-2 Vaccination 06/28/2019, 07/26/2019  . Tdap 03/05/2019  . Zoster Recombinat (Shingrix) 03/05/2019   -Hep C screening: 03/05/2019 NR.  Last colon cancer screening: Never. Last prostate ca screening: PSA 1.9 on 03/05/19. Nocturia x 1.  Negative for high alcohol intake and tobacco use. He drinks alcohol once per week, 1 beer or 1 shot of scotch. -Concerns and/or follow up today:   HLD: Currently he is on atorvastatin 40 mg daily. Cryptogenic stroke, he follows with neurologist and cardiologist.  Upon reviewing records (01/23/2020 neuro visit), his wife reported issues with memory but today he denies problem.  Heart loop recorder has not shown atrial fibrillation or other serious arrhythmia. He is on Aspirin 81 mg daily.  Lab Results  Component Value Date   CHOL 132 08/05/2019   HDL 43.40 08/05/2019   LDLCALC 68 08/05/2019   LDLDIRECT 148.2 03/22/2006   TRIG 106.0 08/05/2019   CHOLHDL 3 08/05/2019   Review of Systems  Constitutional: Negative for activity change, appetite change, fever and unexpected weight change.  HENT: Negative for mouth sores, nosebleeds, sore throat and trouble swallowing.   Eyes: Negative for redness and visual disturbance.  Respiratory: Negative for cough, shortness of breath and wheezing.   Cardiovascular: Negative for chest pain, palpitations and leg swelling.  Gastrointestinal: Negative for abdominal pain, blood in stool, nausea and vomiting.   Endocrine: Negative for cold intolerance, heat intolerance, polydipsia and polyuria.  Genitourinary: Negative for decreased urine volume, dysuria, genital sores, hematuria and testicular pain.  Musculoskeletal: Negative for gait problem and myalgias.  Skin: Negative for color change and rash.  Allergic/Immunologic: Negative for environmental allergies.  Neurological: Negative for syncope, weakness and headaches.  Hematological: Negative for adenopathy. Does not bruise/bleed easily.  Psychiatric/Behavioral: Negative for behavioral problems and confusion.  All other systems reviewed and are negative.  Current Outpatient Medications on File Prior to Visit  Medication Sig Dispense Refill  . aspirin EC 81 MG EC tablet Take 1 tablet (81 mg total) by mouth daily. 30 tablet 3  . atorvastatin (LIPITOR) 40 MG tablet Take 1 tablet (40 mg total) by mouth daily at 6 PM. 90 tablet 2  . fluticasone (CUTIVATE) 0.05 % cream APPLY TOPICALLY 2 (TWO) TIMES DAILY AS NEEDED. 30 g 0   No current facility-administered medications on file prior to visit.    Past Medical History:  Diagnosis Date  . Bell's palsy     Past Surgical History:  Procedure Laterality Date  . BUBBLE STUDY  02/11/2019   Procedure: BUBBLE STUDY;  Surgeon: Jerline Pain, MD;  Location: [facility] ENDOSCOPY;  Service: Cardiovascular;;  . LOOP RECORDER INSERTION N/A 02/11/2019   Procedure: LOOP RECORDER INSERTION;  Surgeon: Deboraha Sprang, MD;  Location: [facility] LAB;  Service: Cardiovascular;  Laterality: N/A;  . TEE WITHOUT CARDIOVERSION N/A 02/11/2019   Procedure: TRANSESOPHAGEAL ECHOCARDIOGRAM (TEE);  Surgeon: Jerline Pain, MD;  Location: [facility] ENDOSCOPY;  Service: Cardiovascular;  Laterality: N/A;   No Known Allergies  Family History  Problem Relation Age of Onset  . Transient ischemic attack Mother   . Transient ischemic attack Father     Social History   Socioeconomic History  . Marital status: Married    Spouse name:  Not on file  . Number of children: Not on file  . Years of education: Not on file  . Highest education level: Not on file  Occupational History  . Not on file  Tobacco Use  . Smoking status: Never Smoker  . Smokeless tobacco: Never Used  Substance and Sexual Activity  . Alcohol use: Not Currently  . Drug use: Not on file  . Sexual activity: Not on file  Other Topics Concern  . Not on file  Social History Narrative  . Not on file   Social Determinants of Health   Financial Resource Strain: Not on file  Food Insecurity: Not on file  Transportation Needs: Not on file  Physical Activity: Not on file  Stress: Not on file  Social Connections: Not on file   Vitals:   04/06/20 0805  BP: 122/82  Pulse: 60  Resp: 16  Temp: 98.1 F (36.7 C)  SpO2: 98%   Body mass index is 28.34 kg/m.  Wt Readings from Last 3 Encounters:  04/06/20 214 lb 12.8 oz (97.4 kg)  01/23/20 209 lb (94.8 kg)  08/05/19 209 lb 8 oz (95 kg)   Physical Exam Vitals and nursing note reviewed.  Constitutional:      General: He is not in acute distress.    Appearance: He is well-developed.  HENT:     Head: Normocephalic and atraumatic.     Right Ear: Tympanic membrane, ear canal and external ear normal.     Left Ear: Tympanic membrane, ear canal and external ear normal.     Mouth/Throat:     Mouth: Oropharynx is clear and moist and mucous membranes are normal. Mucous membranes are moist.     Pharynx: Oropharynx is clear.  Eyes:     Extraocular Movements: Extraocular movements intact and EOM normal.     Conjunctiva/sclera: Conjunctivae normal.     Pupils: Pupils are equal, round, and reactive to light.  Neck:     Thyroid: No thyromegaly.     Trachea: No tracheal deviation.  Cardiovascular:     Rate and Rhythm: Normal rate and regular rhythm.     Pulses:          Dorsalis pedis pulses are 2+ on the right side and 2+ on the left side.     Heart sounds: No murmur heard.   Pulmonary:     Effort:  Pulmonary effort is normal. No respiratory distress.     Breath sounds: Normal breath sounds.  Chest:  Breasts:     Right: No supraclavicular adenopathy.     Left: No supraclavicular adenopathy.    Abdominal:     Palpations: Abdomen is soft. There is no hepatomegaly or mass.     Tenderness: There is no abdominal tenderness.  Genitourinary:    Comments: No concerns. Musculoskeletal:        General: No tenderness or edema.     Cervical back: Normal range of motion.     Comments: No major deformities appreciated and no signs of synovitis.  Lymphadenopathy:     Cervical: No cervical adenopathy.     Upper Body:     Right upper body: No supraclavicular adenopathy.     Left upper body: No supraclavicular adenopathy.  Skin:    General:  Skin is warm.     Findings: No erythema.  Neurological:     General: No focal deficit present.     Mental Status: He is alert and oriented to person, place, and time.     Cranial Nerves: No cranial nerve deficit.     Sensory: No sensory deficit.     Gait: Gait normal.     Deep Tendon Reflexes: Strength normal.     Comments: DTR's 1+ bilateral, symmetric (Biceps and patellar)  Psychiatric:        Mood and Affect: Mood and affect normal.        Cognition and Memory: Cognition and memory normal.    ASSESSMENT AND PLAN:  Mr.Normon was seen today for annual exam.  Diagnoses and all orders for this visit:  Orders Placed This Encounter  Procedures  . Flu Vaccine QUAD High Dose(Fluad)  . Pneumococcal polysaccharide vaccine 23-valent greater than or equal to 2yo subcutaneous/IM  . COMPLETE METABOLIC PANEL WITH GFR  . Lipid panel  . PSA  . Ambulatory referral to Gastroenterology   Lab Results  Component Value Date   PSA 0.52 04/06/2020   Lab Results  Component Value Date   CHOL 145 04/06/2020   HDL 55 04/06/2020   LDLCALC 73 04/06/2020   LDLDIRECT 148.2 03/22/2006   TRIG 85 04/06/2020   CHOLHDL 2.6 04/06/2020   Lab Results  Component  Value Date   CREATININE 1.03 04/06/2020   BUN 20 04/06/2020   NA 143 04/06/2020   K 4.1 04/06/2020   CL 106 04/06/2020   CO2 29 04/06/2020   Lab Results  Component Value Date   ALT 33 04/06/2020   AST 23 04/06/2020   ALKPHOS 64 02/08/2019   BILITOT 0.5 04/06/2020    Routine general medical examination at a health care facility We discussed the importance of regular physical activity and healthy diet for prevention of chronic illness and/or complications. Preventive guidelines reviewed. Vaccination updated.  Next CPE in a year.  Colon cancer screening -     Ambulatory referral to Gastroenterology  Screening for endocrine, metabolic and immunity disorder -     COMPLETE METABOLIC PANEL WITH GFR  Hyperlipidemia, unspecified hyperlipidemia type Continue atorvastatin 40 mg daily. Further recommendation will be given according to lipid panel results.  Prostate cancer screening -     PSA  Need for influenza vaccination -     Flu Vaccine QUAD High Dose(Fluad)  Need for pneumococcal vaccination -     Pneumococcal polysaccharide vaccine 23-valent greater than or equal to 2yo subcutaneous/IM   Return in about 1 year (around 04/06/2021) for cpe.   Mariell Nester G. Martinique, MD  [facility]. [place] office.  A few things to remember from today's visit:   Routine general medical examination at a health care facility  Colon cancer screening - Plan: Ambulatory referral to Gastroenterology  Screening for endocrine, metabolic and immunity disorder - Plan: COMPLETE METABOLIC PANEL WITH GFR  Hyperlipidemia, unspecified hyperlipidemia type - Plan: Lipid panel  Prostate cancer screening - Plan: PSA  If you need refills please call your pharmacy. Do not use My Chart to request refills or for acute issues that need immediate attention.   Please be sure medication list is accurate. If a new problem present, please set up appointment sooner than planned today.  At least  150 minutes of moderate exercise per week, daily brisk walking for 15-30 min is a good exercise option. Healthy diet low in saturated (animal) fats and sweets  and consisting of fresh fruits and vegetables, lean meats such as fish and white chicken and whole grains.  - Vaccines:  Tdap vaccine every 10 years.  Shingles vaccine recommended at age 50, could be given after 65 years of age but not sure about insurance coverage.  Pneumonia vaccines: Pneumovax at 84. Given today.  -Screening recommendations for low/normal risk males:  Screening for diabetes at age 62 and every 3 years. Earlier screening if cardiovascular risk factors.   Lipid screening at 35 and every 3 years. Screening starts in younger males with cardiovascular risk factors. N/A  Colon cancer screening is now at age 67 but your insurance may not cover until age 15 .screening is recommended age 41.  Prostate cancer screening: some controversy, starts usually at 68: Rectal exam and PSA.  Aortic Abdominal Aneurism once between 29 and 62 years old if ever smoker.  Also recommended:  1. Dental visit- Brush and floss your teeth twice daily; visit your dentist twice a year. 2. Eye doctor- Get an eye exam at least every 2 years. 3. Helmet use- Always wear a helmet when riding a bicycle, motorcycle, rollerblading or skateboarding. 4. Safe sex- If you may be exposed to sexually transmitted infections, use a condom. 5. Seat belts- Seat belts can save your live; always wear one. 6. Smoke/Carbon Monoxide detectors- These detectors need to be installed on the appropriate level of your home. Replace batteries at least once a year. 7. Skin cancer- When out in the sun please cover up and use sunscreen 15 SPF or higher. 8. Violence- If anyone is threatening or hurting you, please tell your healthcare provider.  9. Drink alcohol in moderation- Limit alcohol intake to one drink or less per day. Never drink and drive.

## 2020-04-06 NOTE — Patient Instructions (Addendum)
A few things to remember from today's visit:   Routine general medical examination at a health care facility  Colon cancer screening - Plan: Ambulatory referral to Gastroenterology  Screening for endocrine, metabolic and immunity disorder - Plan: COMPLETE METABOLIC PANEL WITH GFR  Hyperlipidemia, unspecified hyperlipidemia type - Plan: Lipid panel  Prostate cancer screening - Plan: PSA  If you need refills please call your pharmacy. Do not use My Chart to request refills or for acute issues that need immediate attention.   Please be sure medication list is accurate. If a new problem present, please set up appointment sooner than planned today.  At least 150 minutes of moderate exercise per week, daily brisk walking for 15-30 min is a good exercise option. Healthy diet low in saturated (animal) fats and sweets and consisting of fresh fruits and vegetables, lean meats such as fish and white chicken and whole grains.  - Vaccines:  Tdap vaccine every 10 years.  Shingles vaccine recommended at age 78, could be given after 64 years of age but not sure about insurance coverage.  Pneumonia vaccines: Pneumovax at 62. Given today.  -Screening recommendations for low/normal risk males:  Screening for diabetes at age 37 and every 3 years. Earlier screening if cardiovascular risk factors.   Lipid screening at 35 and every 3 years. Screening starts in younger males with cardiovascular risk factors. N/A  Colon cancer screening is now at age 68 but your insurance may not cover until age 59 .screening is recommended age 37.  Prostate cancer screening: some controversy, starts usually at 11: Rectal exam and PSA.  Aortic Abdominal Aneurism once between 55 and 6 years old if ever smoker.  Also recommended:  1. Dental visit- Brush and floss your teeth twice daily; visit your dentist twice a year. 2. Eye doctor- Get an eye exam at least every 2 years. 3. Helmet use- Always wear a helmet  when riding a bicycle, motorcycle, rollerblading or skateboarding. 4. Safe sex- If you may be exposed to sexually transmitted infections, use a condom. 5. Seat belts- Seat belts can save your live; always wear one. 6. Smoke/Carbon Monoxide detectors- These detectors need to be installed on the appropriate level of your home. Replace batteries at least once a year. 7. Skin cancer- When out in the sun please cover up and use sunscreen 15 SPF or higher. 8. Violence- If anyone is threatening or hurting you, please tell your healthcare provider.  9. Drink alcohol in moderation- Limit alcohol intake to one drink or less per day. Never drink and drive.

## 2020-04-07 LAB — LIPID PANEL
Cholesterol: 145 mg/dL (ref <200)
HDL: 55 mg/dL (ref >=40)
LDL Cholesterol (Calc): 73 mg/dL (calc)
Non-HDL Cholesterol (Calc): 90 mg/dL (calc) (ref <130)
Total CHOL/HDL Ratio: 2.6 (calc) (ref <5.0)
Triglycerides: 85 mg/dL (ref <150)

## 2020-04-07 LAB — COMPLETE METABOLIC PANEL WITH GFR
AG Ratio: 1.6 (calc) (ref 1.0–2.5)
ALT: 33 U/L (ref 9–46)
AST: 23 U/L (ref 10–35)
Albumin: 4.1 g/dL (ref 3.6–5.1)
Alkaline phosphatase (APISO): 68 U/L (ref 35–144)
BUN: 20 mg/dL (ref 7–25)
CO2: 29 mmol/L (ref 20–32)
Calcium: 9 mg/dL (ref 8.6–10.3)
Chloride: 106 mmol/L (ref 98–110)
Creat: 1.03 mg/dL (ref 0.70–1.25)
GFR, Est African American: 88 mL/min/{1.73_m2} (ref >=60)
GFR, Est Non African American: 76 mL/min/{1.73_m2} (ref >=60)
Globulin: 2.5 g/dL (calc) (ref 1.9–3.7)
Glucose, Bld: 90 mg/dL (ref 65–99)
Potassium: 4.1 mmol/L (ref 3.5–5.3)
Sodium: 143 mmol/L (ref 135–146)
Total Bilirubin: 0.5 mg/dL (ref 0.2–1.2)
Total Protein: 6.6 g/dL (ref 6.1–8.1)

## 2020-04-07 LAB — PSA: PSA: 0.52 ng/mL (ref <=4.0)

## 2020-04-07 MED ORDER — ATORVASTATIN CALCIUM 40 MG PO TABS: 40.0000 mg | ORAL_TABLET | Freq: Every day | ORAL | 3 refills | Status: DC

## 2020-04-11 IMAGING — CT CT ANGIO HEAD
1 of 10 series · 6 of 33 positions shown · IV contrast (omnipaque)
Comparison: Head CT 02/07/2019.

CLINICAL DATA: 63-year-old male with left arm numbness and tingling
since 5766 hours yesterday.

EXAM:
CT ANGIOGRAPHY HEAD AND NECK
TECHNIQUE: Multidetector CT imaging of the head and neck was performed using
the standard protocol during bolus administration of intravenous
contrast. Multiplanar CT image reconstructions and MIPs were
obtained to evaluate the vascular anatomy. Carotid stenosis
measurements (when applicable) are obtained utilizing NASCET
criteria, using the distal internal carotid diameter as the
denominator.
CONTRAST:  100mL OMNIPAQUE IOHEXOL 350 MG/ML SOLN

[Series 11: ax thin · axial · 0.39mm/px · z∈[-239,+15]mm · 6 of 356 slices shown]
[im 51/356  soft-tissue]
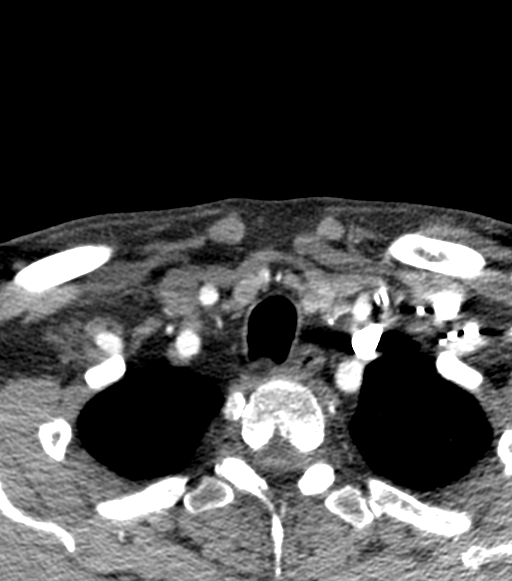
[im 102/356  bone]
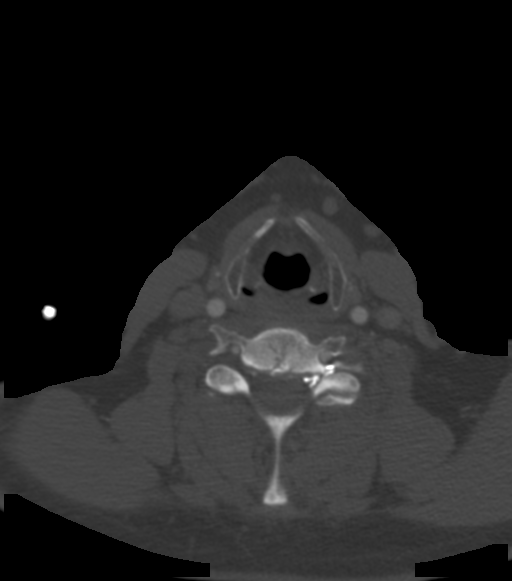
[im 153/356  soft-tissue]
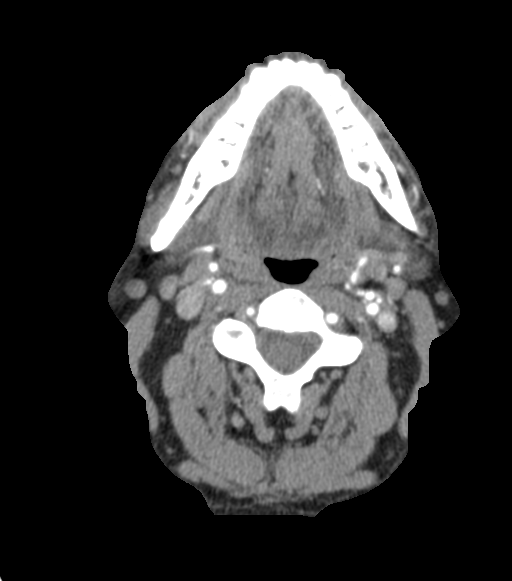
[im 203/356  bone]
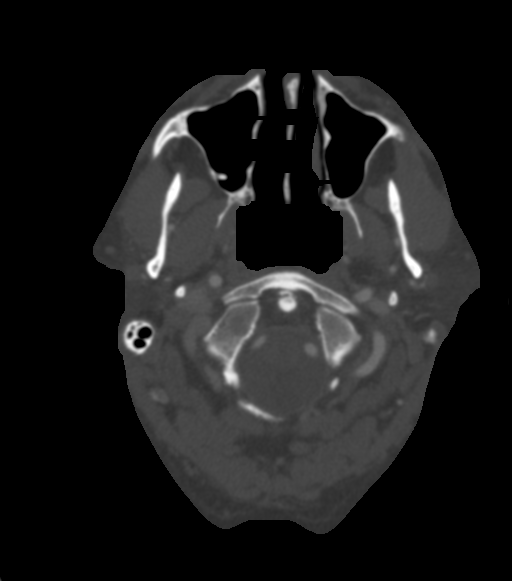
[im 254/356  soft-tissue]
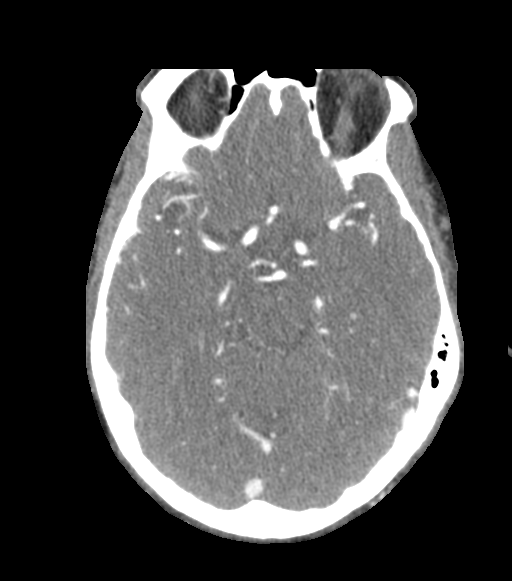
[im 305/356  bone]
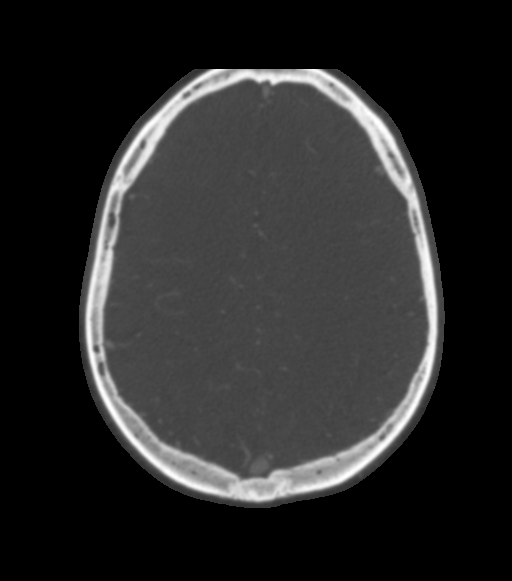

[6 of 33 positions shown; findings below may reference images not displayed]

FINDINGS: CT HEAD

Brain: Stable gray-white matter differentiation throughout the
brain. No midline shift, ventriculomegaly, mass effect, evidence of
mass lesion, intracranial hemorrhage or evidence of cortically based
acute infarction.

Calvarium and skull base: No acute osseous abnormality identified.

Paranasal sinuses: Visualized paranasal sinuses and mastoids are
stable and well pneumatized.

Orbits: Visualized orbits and scalp soft tissues are within normal
limits.

CTA NECK

Skeleton: No acute osseous abnormality identified. Congenital
incomplete ossification of the posterior C1 ring, normal variant.

Upper chest: Negative.

Other neck: Negative.

Aortic arch: 3 vessel arch configuration. No arch atherosclerosis.

Right carotid system: Mildly tortuous brachiocephalic artery and
proximal right CCA without plaque or stenosis. Negative right
carotid bifurcation and cervical right ICA.

Left carotid system: Mildly tortuous proximal left CCA. Negative
left carotid bifurcation and cervical left ICA.

Vertebral arteries:
Negative proximal right subclavian artery. Normal right vertebral
artery origin on series 10, image 587. The right vertebral artery is
mildly non dominant and patent to the skull base without stenosis.

Mild proximal left subclavian artery plaque without stenosis. Normal
left vertebral artery origin. Dominant left vertebral artery with
mildly tortuous V1 segment which is partially obscured by dense
paravertebral contrast. Otherwise the left vertebral artery is
patent to the skull base without stenosis.

CTA HEAD

Posterior circulation: No distal vertebral stenosis, the left is
dominant. Normal right PICA origin. Normal left PICA. Patent
vertebrobasilar junction. Patent basilar artery is tortuous without
stenosis. Normal SCA and left PCA origins. Fetal type right PCA
origin. Left posterior communicating artery is diminutive or absent.
Left PCA branches are within normal limits. There is mild
irregularity and stenosis of the right PCA proximal P2 segment on
series 14, image 53.

Anterior circulation: Both ICA siphons are patent with no plaque or
stenosis. Normal right posterior communicating artery origin. Patent
carotid termini. Normal MCA and ACA origins. Tortuous A1 segments.
Similar ectatic A2 segments. ACA branches otherwise within normal
limits. Left MCA M1 segment and bifurcation are patent with
tortuosity but no stenosis. Left MCA branches are within normal
limits. Right MCA M1 and right MCA bifurcation are patent without
stenosis. Right MCA branches are within normal limits.

Venous sinuses: Patent.

Anatomic variants: Dominant left vertebral artery. Fetal type right
PCA origin.

Review of the MIP images confirms the above findings
IMPRESSION: 1. Negative for large vessel occlusion.
2. Generalized arterial tortuosity in the head and neck, but minimal
atherosclerosis and no significant arterial stenosis.
3. CT appearance of the brain is stable since yesterday.

## 2020-04-19 LAB — CUP PACEART REMOTE DEVICE CHECK
Date Time Interrogation Session: 20211224230107
Implantable Pulse Generator Implant Date: 20201019

## 2020-04-20 ENCOUNTER — Ambulatory Visit (INDEPENDENT_AMBULATORY_CARE_PROVIDER_SITE_OTHER): Payer: BC Managed Care – PPO

## 2020-04-20 DIAGNOSIS — Z20828 Contact with and (suspected) exposure to other viral communicable diseases: Secondary | ICD-10-CM | POA: Diagnosis not present

## 2020-04-20 DIAGNOSIS — I639 Cerebral infarction, unspecified: Secondary | ICD-10-CM

## 2020-04-20 DIAGNOSIS — Z20822 Contact with and (suspected) exposure to covid-19: Secondary | ICD-10-CM | POA: Diagnosis not present

## 2020-05-04 NOTE — Progress Notes (Signed)
Carelink Summary Report / Loop Recorder 

## 2020-05-23 LAB — CUP PACEART REMOTE DEVICE CHECK
Date Time Interrogation Session: 20220126230658
Implantable Pulse Generator Implant Date: 20201019

## 2020-05-25 ENCOUNTER — Ambulatory Visit (INDEPENDENT_AMBULATORY_CARE_PROVIDER_SITE_OTHER): Payer: BC Managed Care – PPO

## 2020-05-25 DIAGNOSIS — I639 Cerebral infarction, unspecified: Secondary | ICD-10-CM | POA: Diagnosis not present

## 2020-06-03 NOTE — Progress Notes (Signed)
Carelink Summary Report / Loop Recorder 

## 2020-06-29 ENCOUNTER — Ambulatory Visit (INDEPENDENT_AMBULATORY_CARE_PROVIDER_SITE_OTHER): Payer: BC Managed Care – PPO

## 2020-06-29 DIAGNOSIS — I639 Cerebral infarction, unspecified: Secondary | ICD-10-CM

## 2020-07-01 LAB — CUP PACEART REMOTE DEVICE CHECK
Date Time Interrogation Session: 20220228230757
Implantable Pulse Generator Implant Date: 20201019

## 2020-07-08 NOTE — Progress Notes (Signed)
Carelink Summary Report / Loop Recorder 

## 2020-08-01 LAB — CUP PACEART REMOTE DEVICE CHECK
Date Time Interrogation Session: 20220402230720
Implantable Pulse Generator Implant Date: 20201019

## 2020-08-03 ENCOUNTER — Ambulatory Visit (INDEPENDENT_AMBULATORY_CARE_PROVIDER_SITE_OTHER): Payer: BC Managed Care – PPO

## 2020-08-03 DIAGNOSIS — I639 Cerebral infarction, unspecified: Secondary | ICD-10-CM | POA: Diagnosis not present

## 2020-08-18 NOTE — Progress Notes (Signed)
Carelink Summary Report / Loop Recorder 

## 2020-09-07 ENCOUNTER — Ambulatory Visit (INDEPENDENT_AMBULATORY_CARE_PROVIDER_SITE_OTHER): Payer: BC Managed Care – PPO

## 2020-09-07 DIAGNOSIS — I639 Cerebral infarction, unspecified: Secondary | ICD-10-CM

## 2020-09-08 LAB — CUP PACEART REMOTE DEVICE CHECK
Date Time Interrogation Session: 20220514230550
Implantable Pulse Generator Implant Date: 20201019

## 2020-09-30 NOTE — Progress Notes (Signed)
Carelink Summary Report / Loop Recorder 

## 2020-10-12 ENCOUNTER — Ambulatory Visit (INDEPENDENT_AMBULATORY_CARE_PROVIDER_SITE_OTHER): Payer: 59

## 2020-10-12 DIAGNOSIS — I639 Cerebral infarction, unspecified: Secondary | ICD-10-CM | POA: Diagnosis not present

## 2020-10-13 LAB — CUP PACEART REMOTE DEVICE CHECK
Date Time Interrogation Session: 20220616230338
Implantable Pulse Generator Implant Date: 20201019

## 2020-11-02 NOTE — Progress Notes (Signed)
Carelink Summary Report / Loop Recorder 

## 2020-11-12 LAB — CUP PACEART REMOTE DEVICE CHECK
Date Time Interrogation Session: 20220719230643
Implantable Pulse Generator Implant Date: 20201019

## 2020-11-16 ENCOUNTER — Ambulatory Visit (INDEPENDENT_AMBULATORY_CARE_PROVIDER_SITE_OTHER): Payer: 59

## 2020-11-16 DIAGNOSIS — I639 Cerebral infarction, unspecified: Secondary | ICD-10-CM | POA: Diagnosis not present

## 2020-12-11 NOTE — Progress Notes (Signed)
Carelink Summary Report / Loop Recorder 

## 2020-12-21 ENCOUNTER — Ambulatory Visit (INDEPENDENT_AMBULATORY_CARE_PROVIDER_SITE_OTHER): Payer: 59

## 2020-12-21 DIAGNOSIS — I639 Cerebral infarction, unspecified: Secondary | ICD-10-CM | POA: Diagnosis not present

## 2020-12-22 LAB — CUP PACEART REMOTE DEVICE CHECK
Date Time Interrogation Session: 20220821230257
Implantable Pulse Generator Implant Date: 20201019

## 2021-01-01 NOTE — Progress Notes (Signed)
Carelink Summary Report / Loop Recorder 

## 2021-01-25 ENCOUNTER — Ambulatory Visit (INDEPENDENT_AMBULATORY_CARE_PROVIDER_SITE_OTHER): Payer: 59

## 2021-01-25 DIAGNOSIS — I639 Cerebral infarction, unspecified: Secondary | ICD-10-CM | POA: Diagnosis not present

## 2021-01-25 LAB — CUP PACEART REMOTE DEVICE CHECK
Date Time Interrogation Session: 20220923230601
Implantable Pulse Generator Implant Date: 20201019

## 2021-02-02 NOTE — Progress Notes (Signed)
Carelink Summary Report / Loop Recorder 

## 2021-02-18 LAB — CUP PACEART REMOTE DEVICE CHECK
Date Time Interrogation Session: 20221026230957
Implantable Pulse Generator Implant Date: 20201019

## 2021-02-24 ENCOUNTER — Telehealth: Payer: Self-pay | Admitting: Internal Medicine

## 2021-02-24 NOTE — Telephone Encounter (Signed)
Pt would like to have his Link 2 defib monitor removed , he said he have had the monitor for 2 years and have not had any problems with it., Please advise the pt further

## 2021-02-25 NOTE — Telephone Encounter (Signed)
Spoke with patient, he would like to discontinue monitoring of ILR and have device removed.  Educated patient that device battery typically lasts 3.5-4years and it is recommended monitoring through that time, however we can respect his wishes to stop monitoring if he chooses.    Pt has app on his phone which he will delete.  Advised patient I will cancel future scheduled checks and remove enrollment from [employer].    Pt would like device removed, advised I will forward to scheduling to contact him to schedule appt for ILR removal.

## 2021-02-25 NOTE — Telephone Encounter (Signed)
Spoke with pt who states he would like to either have LINQ removed or stop monitoring.  Pt states he has not had any episodes in the 2 years he has had loop recorder.  Pt advised will forward to device clinic for further information and review of battery status.  Pt verbalizes understanding and thanked Therapist, sports for the call.

## 2021-02-25 NOTE — Telephone Encounter (Signed)
Attempted phone call to pt and left voicemail message to contact Rn at [phone]. 

## 2021-04-07 NOTE — Progress Notes (Signed)
HPI: [name]. is a 65 y.o.male here today for his routine physical examination.  Last CPE: 04/06/20 He lives with his wife and his 2 sons.   Regular exercise 3 or more times per week: He has not been consistent. Following a healthy diet: Yes, he cooks at home most of the time, eats out 1-2 times per week.   Chronic medical problems: Cryptogenic stroke, TIA,hyperlipidemia, past history of Bell's palsy. Cardiac monitor x 2 years,according to pt, no serious cardiac arrhythmia has been recorded. He asked to stop 2 months ago and planning on having recorder removed.  Immunization History  Administered Date(s) Administered   Fluad Quad(high Dose 65+) 04/06/2020, 04/09/2021   Influenza,inj,Quad PF,6+ Mos 02/26/2019   Moderna Sars-Covid-2 Vaccination 06/28/2019, 07/26/2019   PNEUMOCOCCAL CONJUGATE-20 04/09/2021   Pneumococcal Polysaccharide-23 04/06/2020   Tdap 03/05/2019   Zoster Recombinat (Shingrix) 03/05/2019, 04/09/2021   Health Maintenance  Topic Date Due   COVID-19 Vaccine (3 - Booster for Moderna series) 04/25/2021 (Originally 09/20/2019)   COLONOSCOPY (Pts 45-38yrs Insurance coverage will need to be confirmed)  05/01/2023 (Originally 01/28/2000)   TETANUS/TDAP  03/04/2029   Pneumonia Vaccine 37+ Years old  Completed   INFLUENZA VACCINE  Completed   Hepatitis C Screening  Completed   Zoster Vaccines- Shingrix  Completed   HPV VACCINES  Aged Out   Last prostate ca screening: 0.52 04/06/20 Nocturia x 0-couple times/night. Stable, depends of amount of fluid intake at the end of the days. -Negative for high alcohol intake or tobacco use.  -Concerns and/or follow up today:   Hyperlipidemia: Currently on Atorvastatin 40 mg daily. Side effects from medication:none  Lab Results  Component Value Date   CHOL 145 04/06/2020   HDL 55 04/06/2020   LDLCALC 73 04/06/2020   LDLDIRECT 148.2 03/22/2006   TRIG 85 04/06/2020   CHOLHDL 2.6 04/06/2020   Review of  Systems  Constitutional:  Negative for activity change, appetite change, fatigue and fever.  HENT:  Negative for mouth sores, nosebleeds, sore throat, trouble swallowing and voice change.   Eyes:  Negative for redness and visual disturbance.  Respiratory:  Negative for cough, shortness of breath and wheezing.   Cardiovascular:  Negative for chest pain, palpitations and leg swelling.  Gastrointestinal:  Negative for abdominal pain, blood in stool, nausea and vomiting.  Endocrine: Negative for cold intolerance, heat intolerance, polydipsia, polyphagia and polyuria.  Genitourinary:  Negative for decreased urine volume, dysuria, genital sores, hematuria and testicular pain.  Musculoskeletal:  Negative for gait problem and myalgias.  Skin:  Negative for color change and rash.  Allergic/Immunologic: Negative for environmental allergies.  Neurological:  Negative for syncope, weakness and headaches.  Hematological:  Negative for adenopathy. Does not bruise/bleed easily.  Psychiatric/Behavioral:  Negative for confusion. The patient is not nervous/anxious.   All other systems reviewed and are negative.  Current Outpatient Medications on File Prior to Visit  Medication Sig Dispense Refill   aspirin EC 81 MG EC tablet Take 1 tablet (81 mg total) by mouth daily. 30 tablet 3   atorvastatin (LIPITOR) 40 MG tablet Take 1 tablet (40 mg total) by mouth daily at 6 PM. 90 tablet 3   fluticasone (CUTIVATE) 0.05 % cream APPLY TOPICALLY 2 (TWO) TIMES DAILY AS NEEDED. 30 g 0   No current facility-administered medications on file prior to visit.   Past Medical History:  Diagnosis Date   Bell's palsy    Past Surgical History:  Procedure Laterality Date   BUBBLE STUDY  02/11/2019   Procedure: BUBBLE STUDY;  Surgeon: Jerline Pain, MD;  Location: [facility] ENDOSCOPY;  Service: Cardiovascular;;   LOOP RECORDER INSERTION N/A 02/11/2019   Procedure: LOOP RECORDER INSERTION;  Surgeon: Deboraha Sprang, MD;  Location: [facility];  Service: Cardiovascular;  Laterality: N/A;   TEE WITHOUT CARDIOVERSION N/A 02/11/2019   Procedure: TRANSESOPHAGEAL ECHOCARDIOGRAM (TEE);  Surgeon: Jerline Pain, MD;  Location: [facility] ENDOSCOPY;  Service: Cardiovascular;  Laterality: N/A;   No Known Allergies  Family History  Problem Relation Age of Onset   Transient ischemic attack Mother    Transient ischemic attack Father    Social History   Socioeconomic History   Marital status: Married    Spouse name: Not on file   Number of children: Not on file   Years of education: Not on file   Highest education level: Not on file  Occupational History   Not on file  Tobacco Use   Smoking status: Never   Smokeless tobacco: Never  Substance and Sexual Activity   Alcohol use: Not Currently   Drug use: Not on file   Sexual activity: Not on file  Other Topics Concern   Not on file  Social History Narrative   Not on file   Social Determinants of Health   Financial Resource Strain: Not on file  Food Insecurity: Not on file  Transportation Needs: Not on file  Physical Activity: Not on file  Stress: Not on file  Social Connections: Not on file   Vitals:   04/09/21 0758  BP: 128/80  Pulse: 60  Resp: 16  SpO2: 98%   Body mass index is 28.28 kg/m.  Wt Readings from Last 3 Encounters:  04/09/21 214 lb 6 oz (97.2 kg)  04/06/20 214 lb 12.8 oz (97.4 kg)  01/23/20 209 lb (94.8 kg)   Physical Exam Vitals and nursing note reviewed.  Constitutional:      General: He is not in acute distress.    Appearance: He is well-developed.  HENT:     Head: Normocephalic and atraumatic.     Right Ear: Tympanic membrane, ear canal and external ear normal.     Left Ear: Tympanic membrane, ear canal and external ear normal.     Mouth/Throat:     Mouth: Mucous membranes are moist.     Pharynx: Oropharynx is clear.  Eyes:     Extraocular Movements: Extraocular movements intact.     Conjunctiva/sclera: Conjunctivae normal.      Pupils: Pupils are equal, round, and reactive to light.  Neck:     Thyroid: No thyromegaly.     Trachea: No tracheal deviation.  Cardiovascular:     Rate and Rhythm: Normal rate and regular rhythm.     Pulses:          Dorsalis pedis pulses are 2+ on the right side and 2+ on the left side.     Heart sounds: No murmur heard. Pulmonary:     Effort: Pulmonary effort is normal. No respiratory distress.     Breath sounds: Normal breath sounds.  Abdominal:     Palpations: Abdomen is soft. There is no hepatomegaly or mass.     Tenderness: There is no abdominal tenderness.  Genitourinary:    Comments: No concerns. Musculoskeletal:        General: No tenderness.     Cervical back: Normal range of motion.     Comments: No major deformities appreciated and no signs of synovitis.  Lymphadenopathy:  Cervical: No cervical adenopathy.     Upper Body:     Right upper body: No supraclavicular adenopathy.     Left upper body: No supraclavicular adenopathy.  Skin:    General: Skin is warm.     Findings: No erythema.  Neurological:     General: No focal deficit present.     Mental Status: He is alert and oriented to person, place, and time.     Cranial Nerves: No cranial nerve deficit.     Sensory: No sensory deficit.     Coordination: Coordination normal.     Gait: Gait normal.     Deep Tendon Reflexes:     Reflex Scores:      Bicep reflexes are 2+ on the right side and 2+ on the left side.      Patellar reflexes are 2+ on the right side and 2+ on the left side. Psychiatric:        Mood and Affect: Mood and affect normal.   ASSESSMENT AND PLAN:  [name] was seen today for annual exam.  Diagnoses and all orders for this visit: Orders Placed This Encounter  Procedures   Varicella-zoster vaccine IM   Flu Vaccine QUAD High Dose(Fluad)   Pneumococcal conjugate vaccine 20-valent (Prevnar 20)   CMP   Lipid panel   PSA   Hemoglobin A1c   Ambulatory referral to Gastroenterology    Lab Results  Component Value Date   HGBA1C 5.6 04/09/2021   Lab Results  Component Value Date   CREATININE 1.12 04/09/2021   BUN 21 04/09/2021   NA 141 04/09/2021   K 3.8 04/09/2021   CL 104 04/09/2021   CO2 31 04/09/2021   Lab Results  Component Value Date   ALT 32 04/09/2021   AST 19 04/09/2021   ALKPHOS 69 04/09/2021   BILITOT 0.5 04/09/2021   Lab Results  Component Value Date   CHOL 169 04/09/2021   HDL 56.80 04/09/2021   LDLCALC 87 04/09/2021   LDLDIRECT 148.2 03/22/2006   TRIG 123.0 04/09/2021   CHOLHDL 3 04/09/2021   Routine general medical examination at a health care facility We discussed the importance of regular physical activity and healthy diet for prevention of chronic illness and/or complications. Preventive guidelines reviewed. Vaccination updated.  Next CPE in a year.  Screening for endocrine, metabolic and immunity disorder -     Hemoglobin A1c  Prostate cancer screening -     PSA  Colon cancer screening -     Ambulatory referral to Gastroenterology  Need for shingles vaccine -     Varicella-zoster vaccine IM  Need for influenza vaccination -     Flu Vaccine QUAD High Dose(Fluad)  Need for pneumococcal vaccination -     Pneumococcal conjugate vaccine 20-valent (Prevnar 20)  Hyperlipidemia, unspecified Continue Atorvastatin 20 mg daily and low fat diet. Further recommendation will be given according to lipid panel result.  Return in 1 year (on 04/09/2022) for CPE.  Alfretta Pinch G. Martinique, MD  [facility]. [place] office.

## 2021-04-09 ENCOUNTER — Encounter: Payer: Self-pay | Admitting: Family Medicine

## 2021-04-09 ENCOUNTER — Ambulatory Visit (INDEPENDENT_AMBULATORY_CARE_PROVIDER_SITE_OTHER): Payer: 59 | Admitting: Family Medicine

## 2021-04-09 VITALS — BP 128/80 | HR 60 | Resp 16 | Ht 73.0 in | Wt 214.4 lb

## 2021-04-09 DIAGNOSIS — Z125 Encounter for screening for malignant neoplasm of prostate: Secondary | ICD-10-CM

## 2021-04-09 DIAGNOSIS — E785 Hyperlipidemia, unspecified: Secondary | ICD-10-CM | POA: Diagnosis not present

## 2021-04-09 DIAGNOSIS — Z0000 Encounter for general adult medical examination without abnormal findings: Secondary | ICD-10-CM

## 2021-04-09 DIAGNOSIS — Z1329 Encounter for screening for other suspected endocrine disorder: Secondary | ICD-10-CM | POA: Diagnosis not present

## 2021-04-09 DIAGNOSIS — Z13228 Encounter for screening for other metabolic disorders: Secondary | ICD-10-CM

## 2021-04-09 DIAGNOSIS — Z23 Encounter for immunization: Secondary | ICD-10-CM | POA: Diagnosis not present

## 2021-04-09 DIAGNOSIS — Z130 Encounter for screening for diseases of the blood and blood-forming organs and certain disorders involving the immune mechanism: Secondary | ICD-10-CM

## 2021-04-09 DIAGNOSIS — Z1211 Encounter for screening for malignant neoplasm of colon: Secondary | ICD-10-CM

## 2021-04-09 LAB — LIPID PANEL
Cholesterol: 169 mg/dL (ref 0–200)
HDL: 56.8 mg/dL (ref >39.00)
LDL Cholesterol: 87 mg/dL (ref 0–99)
NonHDL: 111.83
Total CHOL/HDL Ratio: 3
Triglycerides: 123 mg/dL (ref 0.0–149.0)
VLDL: 24.6 mg/dL (ref 0.0–40.0)

## 2021-04-09 LAB — COMPREHENSIVE METABOLIC PANEL
ALT: 32 U/L (ref 0–53)
AST: 19 U/L (ref 0–37)
Albumin: 4.3 g/dL (ref 3.5–5.2)
Alkaline Phosphatase: 69 U/L (ref 39–117)
BUN: 21 mg/dL (ref 6–23)
CO2: 31 mEq/L (ref 19–32)
Calcium: 9.1 mg/dL (ref 8.4–10.5)
Chloride: 104 mEq/L (ref 96–112)
Creatinine, Ser: 1.12 mg/dL (ref 0.40–1.50)
GFR: 68.61 mL/min (ref >60.00)
Glucose, Bld: 93 mg/dL (ref 70–99)
Potassium: 3.8 mEq/L (ref 3.5–5.1)
Sodium: 141 mEq/L (ref 135–145)
Total Bilirubin: 0.5 mg/dL (ref 0.2–1.2)
Total Protein: 6.9 g/dL (ref 6.0–8.3)

## 2021-04-09 LAB — HEMOGLOBIN A1C: Hgb A1c MFr Bld: 5.6 % (ref 4.6–6.5)

## 2021-04-09 LAB — PSA: PSA: 2.66 ng/mL (ref 0.10–4.00)

## 2021-04-09 NOTE — Patient Instructions (Addendum)
A few things to remember from today's visit:  Routine general medical examination at a health care facility  Screening for endocrine, metabolic and immunity disorder - Plan: Hemoglobin A1c  Hyperlipidemia, unspecified hyperlipidemia type - Plan: CMP, Lipid panel  Prostate cancer screening - Plan: PSA  Colon cancer screening - Plan: Ambulatory referral to Gastroenterology  If you need refills please call your pharmacy. Do not use My Chart to request refills or for acute issues that need immediate attention.    Please be sure medication list is accurate. If a new problem present, please set up appointment sooner than planned today.  Health Maintenance, Male Adopting a healthy lifestyle and getting preventive care are important in promoting health and wellness. Ask your health care provider about: The right schedule for you to have regular tests and exams. Things you can do on your own to prevent diseases and keep yourself healthy. What should I know about diet, weight, and exercise? Eat a healthy diet  Eat a diet that includes plenty of vegetables, fruits, low-fat dairy products, and lean protein. Do not eat a lot of foods that are high in solid fats, added sugars, or sodium. Maintain a healthy weight Body mass index (BMI) is a measurement that can be used to identify possible weight problems. It estimates body fat based on height and weight. Your health care provider can help determine your BMI and help you achieve or maintain a healthy weight. Get regular exercise Get regular exercise. This is one of the most important things you can do for your health. Most adults should: Exercise for at least 150 minutes each week. The exercise should increase your heart rate and make you sweat (moderate-intensity exercise). Do strengthening exercises at least twice a week. This is in addition to the moderate-intensity exercise. Spend less time sitting. Even light physical activity can be  beneficial. Watch cholesterol and blood lipids Have your blood tested for lipids and cholesterol at 66 years of age, then have this test every 5 years. You may need to have your cholesterol levels checked more often if: Your lipid or cholesterol levels are high. You are older than 66 years of age. You are at high risk for heart disease. What should I know about cancer screening? Many types of cancers can be detected early and may often be prevented. Depending on your health history and family history, you may need to have cancer screening at various ages. This may include screening for: Colorectal cancer. Prostate cancer. Skin cancer. Lung cancer. What should I know about heart disease, diabetes, and high blood pressure? Blood pressure and heart disease High blood pressure causes heart disease and increases the risk of stroke. This is more likely to develop in people who have high blood pressure readings or are overweight. Talk with your health care provider about your target blood pressure readings. Have your blood pressure checked: Every 3-5 years if you are 66-58 years of age. Every year if you are 18 years old or older. If you are between the ages of 72 and 75 and are a current or former smoker, ask your health care provider if you should have a one-time screening for abdominal aortic aneurysm (AAA). Diabetes Have regular diabetes screenings. This checks your fasting blood sugar level. Have the screening done: Once every three years after age 66 if you are at a normal weight and have a low risk for diabetes. More often and at a younger age if you are overweight or have a high  risk for diabetes. What should I know about preventing infection? Hepatitis B If you have a higher risk for hepatitis B, you should be screened for this virus. Talk with your health care provider to find out if you are at risk for hepatitis B infection. Hepatitis C Blood testing is recommended for: Everyone  born from [birth year] through 1966. Anyone with known risk factors for hepatitis C. Sexually transmitted infections (STIs) You should be screened each year for STIs, including gonorrhea and chlamydia, if: You are sexually active and are younger than 66 years of age. You are older than 66 years of age and your health care provider tells you that you are at risk for this type of infection. Your sexual activity has changed since you were last screened, and you are at increased risk for chlamydia or gonorrhea. Ask your health care provider if you are at risk. Ask your health care provider about whether you are at high risk for HIV. Your health care provider may recommend a prescription medicine to help prevent HIV infection. If you choose to take medicine to prevent HIV, you should first get tested for HIV. You should then be tested every 3 months for as long as you are taking the medicine. Follow these instructions at home: Alcohol use Do not drink alcohol if your health care provider tells you not to drink. If you drink alcohol: Limit how much you have to 0-2 drinks a day. Know how much alcohol is in your drink. In the [place] one drink equals one 12 oz bottle of beer (355 mL), one 5 oz glass of wine (148 mL), or one 1 oz glass of hard liquor (44 mL). Lifestyle Do not use any products that contain nicotine or tobacco. These products include cigarettes, chewing tobacco, and vaping devices, such as e-cigarettes. If you need help quitting, ask your health care provider. Do not use street drugs. Do not share needles. Ask your health care provider for help if you need support or information about quitting drugs. General instructions Schedule regular health, dental, and eye exams. Stay current with your vaccines. Tell your health care provider if: You often feel depressed. You have ever been abused or do not feel safe at home. Summary Adopting a healthy lifestyle and getting preventive care are important  in promoting health and wellness. Follow your health care provider's instructions about healthy diet, exercising, and getting tested or screened for diseases. Follow your health care provider's instructions on monitoring your cholesterol and blood pressure. This information is not intended to replace advice given to you by your health care provider. Make sure you discuss any questions you have with your health care provider. Document Revised: 08/31/2020 Document Reviewed: 08/31/2020 Elsevier Patient Education  [place].

## 2021-04-09 NOTE — Assessment & Plan Note (Addendum)
Continue Atorvastatin 20 mg daily and low fat diet. Further recommendation will be given according to lipid panel result.

## 2021-06-28 ENCOUNTER — Encounter: Payer: Self-pay | Admitting: Family Medicine

## 2021-06-28 MED ORDER — ATORVASTATIN CALCIUM 80 MG PO TABS: 80.0000 mg | ORAL_TABLET | Freq: Every day | ORAL | 3 refills | Status: DC

## 2021-06-28 MED ORDER — ASPIRIN 81 MG PO TBEC: 81.0000 mg | DELAYED_RELEASE_TABLET | Freq: Every day | ORAL | 3 refills | Status: DC

## 2022-02-21 ENCOUNTER — Telehealth: Payer: 59 | Admitting: Physician Assistant

## 2022-02-21 DIAGNOSIS — B001 Herpesviral vesicular dermatitis: Secondary | ICD-10-CM

## 2022-02-21 MED ORDER — VALACYCLOVIR HCL 1 G PO TABS: 2000.0000 mg | ORAL_TABLET | Freq: Two times a day (BID) | ORAL | 0 refills | Status: AC

## 2022-02-21 NOTE — Progress Notes (Signed)

## 2022-02-21 NOTE — Progress Notes (Signed)
I have spent 5 minutes in review of e-visit questionnaire, review and updating patient chart, medical decision making and response to patient.   Everlee Quakenbush Cody Dorsel Flinn, PA-C    

## 2022-05-03 NOTE — Progress Notes (Signed)
HPI: [name]. is a 67 y.o.male with PMHx significant for cryptogenic stroke and HLD here today with his wife for his routine physical examination.  Last CPE: 04/09/2021  He reports no new problems or visits to the ER since the last visit.   He admits to not exercising regularly. He reports following a healthy diet, consuming vegetables daily, and eating more chicken and red meat than fish. He denies smoking and reports moderate alcohol consumption. Sleeping only four to five hours per night, which he believes is a recent change since his last visit.  Immunization History  Administered Date(s) Administered   Fluad Quad(high Dose 65+) 04/06/2020, 04/09/2021   Influenza,inj,Quad PF,6+ Mos 02/26/2019   Moderna Sars-Covid-2 Vaccination 06/28/2019, 07/26/2019   PNEUMOCOCCAL CONJUGATE-20 04/09/2021   Pneumococcal Polysaccharide-23 04/06/2020   Tdap 03/05/2019   Zoster Recombinat (Shingrix) 03/05/2019, 04/09/2021    Health Maintenance  Topic Date Due   INFLUENZA VACCINE  11/23/2021   COVID-19 Vaccine (3 - 2023-24 season) 05/19/2022 (Originally 12/24/2021)   COLONOSCOPY (Pts 45-19yr Insurance coverage will need to be confirmed)  05/01/2023 (Originally 01/28/2000)   DTaP/Tdap/Td (2 - Td or Tdap) 03/04/2029   Pneumonia Vaccine 67 Years old  Completed   Hepatitis C Screening  Completed   Zoster Vaccines- Shingrix  Completed   HPV VACCINES  Aged Out    Last prostate ca screening: PSA 2.6 in 03/2021. Nocturia, getting up once or twice per night to urinate, which he reports as his normal pattern.   He has concerns about atorvastatin and its potential side effects on memory and cognitive function.  Cryptogenic stroke, since event he has  had short-term memory issues. Heart monitor x 3 years otherwise negative.  His wife reports noticing a decline in his memory and personality changes, including increased fogginess and indecisiveness, particularly after the statin  dosage was doubled. Lab Results  Component Value Date   CHOL 169 04/09/2021   HDL 56.80 04/09/2021   LDLCALC 87 04/09/2021   LDLDIRECT 148.2 03/22/2006   TRIG 123.0 04/09/2021   CHOLHDL 3 04/09/2021   Regarding mood, he denies feeling sad or hopeless but acknowledges being more irritable lately.   He has not seen a neurologist in over two years.   He has never had a colonoscopy, with a previously scheduled appointment in 2020 being canceled.   Left-sided facial hyperpigmented skin lesion for many years. He has not noted significant changes, it is not tender or pruritic.  Review of Systems  Constitutional:  Negative for activity change, appetite change and fever.  HENT:  Negative for nosebleeds, sore throat, trouble swallowing and voice change.   Eyes:  Negative for redness and visual disturbance.  Respiratory:  Negative for apnea, cough, shortness of breath and wheezing.   Cardiovascular:  Negative for chest pain, palpitations and leg swelling.  Gastrointestinal:  Negative for abdominal pain, blood in stool, nausea and vomiting.  Endocrine: Negative for cold intolerance, heat intolerance, polydipsia, polyphagia and polyuria.  Genitourinary:  Negative for decreased urine volume, dysuria, genital sores, hematuria and testicular pain.  Musculoskeletal:  Negative for gait problem and myalgias.  Skin:  Negative for color change and rash.  Allergic/Immunologic: Negative for environmental allergies.  Neurological:  Negative for syncope, weakness, numbness and headaches.  Hematological:  Negative for adenopathy. Does not bruise/bleed easily.  Psychiatric/Behavioral:  Positive for sleep disturbance. Negative for confusion. The patient is not nervous/anxious.    Current Outpatient Medications on File Prior to Visit  Medication Sig Dispense  Refill   aspirin 81 MG EC tablet Take 1 tablet (81 mg total) by mouth daily. 90 tablet 3   fluticasone (CUTIVATE) 0.05 % cream APPLY TOPICALLY 2 (TWO)  TIMES DAILY AS NEEDED. 30 g 0   No current facility-administered medications on file prior to visit.   Past Medical History:  Diagnosis Date   Bell's palsy    Past Surgical History:  Procedure Laterality Date   BUBBLE STUDY  02/11/2019   Procedure: BUBBLE STUDY;  Surgeon: Jerline Pain, MD;  Location: [facility];  Service: Cardiovascular;;   LOOP RECORDER INSERTION N/A 02/11/2019   Procedure: LOOP RECORDER INSERTION;  Surgeon: Deboraha Sprang, MD;  Location: [facility];  Service: Cardiovascular;  Laterality: N/A;   TEE WITHOUT CARDIOVERSION N/A 02/11/2019   Procedure: TRANSESOPHAGEAL ECHOCARDIOGRAM (TEE);  Surgeon: Jerline Pain, MD;  Location[facility];  Service: Cardiovascular;  Laterality: N/A;   No Known Allergies  Family History  Problem Relation Age of Onset   Transient ischemic attack Mother    Transient ischemic attack Father    Social History   Socioeconomic History   Marital status: Married    Spouse name: Not on file   Number of children: Not on file   Years of education: Not on file   Highest education level: Not on file  Occupational History   Not on file  Tobacco Use   Smoking status: Never   Smokeless tobacco: Never  Substance and Sexual Activity   Alcohol use: Not Currently   Drug use: Not on file   Sexual activity: Not on file  Other Topics Concern   Not on file  Social History Narrative   Not on file   Social Determinants of Health   Financial Resource Strain: Not on file  Food Insecurity: Not on file  Transportation Needs: Not on file  Physical Activity: Not on file  Stress: Not on file  Social Connections: Not on file   Vitals:   05/04/22 0916  BP: 128/70  Pulse: 90  Resp: 16  Temp: 98.6 F (37 C)  SpO2: 97%  Body mass index is 26.7 kg/m. Wt Readings from Last 3 Encounters:  05/04/22 202 lb 6 oz (91.8 kg)  04/09/21 214 lb 6 oz (97.2 kg)  04/06/20 214 lb 12.8 oz (97.4 kg)   Physical Exam Vitals and nursing note  reviewed.  Constitutional:      General: He is not in acute distress.    Appearance: He is well-developed.  HENT:     Head: Normocephalic and atraumatic.      Right Ear: Tympanic membrane, ear canal and external ear normal.     Left Ear: Tympanic membrane, ear canal and external ear normal.     Mouth/Throat:     Mouth: Mucous membranes are moist.     Pharynx: Oropharynx is clear.  Eyes:     Extraocular Movements: Extraocular movements intact.     Conjunctiva/sclera: Conjunctivae normal.     Pupils: Pupils are equal, round, and reactive to light.  Neck:     Thyroid: No thyromegaly.     Trachea: No tracheal deviation.  Cardiovascular:     Rate and Rhythm: Normal rate and regular rhythm.     Pulses:          Dorsalis pedis pulses are 2+ on the right side and 2+ on the left side.     Heart sounds: No murmur heard. Pulmonary:     Effort: Pulmonary effort is normal.  No respiratory distress.     Breath sounds: Normal breath sounds.  Abdominal:     Palpations: Abdomen is soft. There is no hepatomegaly or mass.     Tenderness: There is no abdominal tenderness.  Genitourinary:    Comments: No concerns. Musculoskeletal:        General: No tenderness.     Cervical back: Normal range of motion.     Comments: No major deformities appreciated and no signs of synovitis.  Lymphadenopathy:     Cervical: No cervical adenopathy.     Upper Body:     Right upper body: No supraclavicular adenopathy.     Left upper body: No supraclavicular adenopathy.  Skin:    General: Skin is warm.     Findings: No erythema.  Neurological:     General: No focal deficit present.     Mental Status: He is alert and oriented to person, place, and time.     Cranial Nerves: No cranial nerve deficit.     Sensory: No sensory deficit.     Gait: Gait normal.     Deep Tendon Reflexes:     Reflex Scores:      Bicep reflexes are 2+ on the right side and 2+ on the left side.      Patellar reflexes are 2+ on the  right side and 2+ on the left side. Psychiatric:        Mood and Affect: Mood and affect normal.   ASSESSMENT AND PLAN:  Mr.Cillian was seen today for annual exam.  Diagnoses and all orders for this visit: Lab Results  Component Value Date   PSA 0.99 05/04/2022   PSA 2.66 04/09/2021   PSA 0.52 04/06/2020    Lab Results  Component Value Date   CREATININE 1.06 05/04/2022   BUN 13 05/04/2022   NA 143 05/04/2022   K 5.0 05/04/2022   CL 103 05/04/2022   CO2 33 (H) 05/04/2022   Lab Results  Component Value Date   ALT 39 05/04/2022   AST 26 05/04/2022   ALKPHOS 94 05/04/2022   BILITOT 0.6 05/04/2022   Lab Results  Component Value Date   TSH 1.77 05/04/2022   Routine general medical examination at a health care facility Assessment & Plan: We discussed the importance of regular physical activity and healthy diet for prevention of chronic illness and/or complications. Preventive guidelines reviewed. Vaccination up to date. Next CPE in a year.   Hyperlipidemia, unspecified hyperlipidemia type Assessment & Plan: LDL 87 in 03/2021. We discussed CV benefits of statins. He feels more irritable and some cognitive changes sicne Atorvastatin dose was increased. Today we decided to stop medication for 3 months and see if he feels better. Will plan on checking FLP in 3 months.  Orders: -     Comprehensive metabolic panel; Future  Prostate cancer screening -     PSA  Screening for endocrine, metabolic and immunity disorder -     Hemoglobin A1c; Future  Insomnia, unspecified type Assessment & Plan: He feels like Atorvastatin is causing problem, discontinued today. Good sleep hygiene. If not better he can try Trazodone 25-50 mg at bedtime, some side effects discussed. F/U in 6 months.  Orders: -     TSH; Future -     CBC; Future -     traZODone HCl; Take 0.5-1 tablets (25-50 mg total) by mouth at bedtime as needed for sleep.  Dispense: 30 tablet; Refill:  1  Cryptogenic stroke [facility]) Assessment & Plan:  Residual short term memory difficulties. He still works full time. Extensive work up was negative. He is going to stop Atorvastatin for a couple months due to possible side effects. Continue Aspirin 81 mg daily.   Need for influenza vaccination -     Flu Vaccine QUAD High Dose(Fluad)  Colon cancer screening -     Ambulatory referral to Gastroenterology  Melanocytic nevi of face Assessment & Plan: Has had lesion for years. Recommend  derma evaluation, may need Bx. Continue monitoring for changes.  Orders: -     Ambulatory referral to Dermatology   Return in 6 months (on 11/02/2022) for Fasting labs in 3 months and f/u in 6 months if taking Trazodone..  Anwita Mencer G. [place], MD  [facility]. [place] office.

## 2022-05-04 ENCOUNTER — Encounter: Payer: Self-pay | Admitting: Family Medicine

## 2022-05-04 ENCOUNTER — Ambulatory Visit (INDEPENDENT_AMBULATORY_CARE_PROVIDER_SITE_OTHER): Payer: 59 | Admitting: Family Medicine

## 2022-05-04 VITALS — BP 128/70 | HR 90 | Temp 98.6°F | Resp 16 | Ht 73.0 in | Wt 202.4 lb

## 2022-05-04 DIAGNOSIS — Z1329 Encounter for screening for other suspected endocrine disorder: Secondary | ICD-10-CM | POA: Diagnosis not present

## 2022-05-04 DIAGNOSIS — Z23 Encounter for immunization: Secondary | ICD-10-CM

## 2022-05-04 DIAGNOSIS — D2230 Melanocytic nevi of unspecified part of face: Secondary | ICD-10-CM | POA: Insufficient documentation

## 2022-05-04 DIAGNOSIS — Z130 Encounter for screening for diseases of the blood and blood-forming organs and certain disorders involving the immune mechanism: Secondary | ICD-10-CM | POA: Diagnosis not present

## 2022-05-04 DIAGNOSIS — E785 Hyperlipidemia, unspecified: Secondary | ICD-10-CM | POA: Diagnosis not present

## 2022-05-04 DIAGNOSIS — Z0000 Encounter for general adult medical examination without abnormal findings: Secondary | ICD-10-CM | POA: Insufficient documentation

## 2022-05-04 DIAGNOSIS — G4700 Insomnia, unspecified: Secondary | ICD-10-CM | POA: Diagnosis not present

## 2022-05-04 DIAGNOSIS — Z13228 Encounter for screening for other metabolic disorders: Secondary | ICD-10-CM | POA: Diagnosis not present

## 2022-05-04 DIAGNOSIS — Z125 Encounter for screening for malignant neoplasm of prostate: Secondary | ICD-10-CM | POA: Diagnosis not present

## 2022-05-04 DIAGNOSIS — Z1211 Encounter for screening for malignant neoplasm of colon: Secondary | ICD-10-CM

## 2022-05-04 DIAGNOSIS — I639 Cerebral infarction, unspecified: Secondary | ICD-10-CM

## 2022-05-04 DIAGNOSIS — L819 Disorder of pigmentation, unspecified: Secondary | ICD-10-CM

## 2022-05-04 LAB — COMPREHENSIVE METABOLIC PANEL
ALT: 39 U/L (ref 0–53)
AST: 26 U/L (ref 0–37)
Albumin: 4.5 g/dL (ref 3.5–5.2)
Alkaline Phosphatase: 94 U/L (ref 39–117)
BUN: 13 mg/dL (ref 6–23)
CO2: 33 mEq/L — ABNORMAL HIGH (ref 19–32)
Calcium: 9.6 mg/dL (ref 8.4–10.5)
Chloride: 103 mEq/L (ref 96–112)
Creatinine, Ser: 1.06 mg/dL (ref 0.40–1.50)
GFR: 72.75 mL/min (ref >60.00)
Glucose, Bld: 102 mg/dL — ABNORMAL HIGH (ref 70–99)
Potassium: 5 mEq/L (ref 3.5–5.1)
Sodium: 143 mEq/L (ref 135–145)
Total Bilirubin: 0.6 mg/dL (ref 0.2–1.2)
Total Protein: 7.3 g/dL (ref 6.0–8.3)

## 2022-05-04 LAB — PSA: PSA: 0.99 ng/mL (ref 0.10–4.00)

## 2022-05-04 LAB — CBC
HCT: 45.7 % (ref 39.0–52.0)
Hemoglobin: 15.6 g/dL (ref 13.0–17.0)
MCHC: 34.2 g/dL (ref 30.0–36.0)
MCV: 97.8 fl (ref 78.0–100.0)
Platelets: 289 10*3/uL (ref 150.0–400.0)
RBC: 4.67 Mil/uL (ref 4.22–5.81)
RDW: 13 % (ref 11.5–15.5)
WBC: 5.8 10*3/uL (ref 4.0–10.5)

## 2022-05-04 LAB — TSH: TSH: 1.77 u[IU]/mL (ref 0.35–5.50)

## 2022-05-04 LAB — HEMOGLOBIN A1C: Hgb A1c MFr Bld: 5.7 % (ref 4.6–6.5)

## 2022-05-04 MED ORDER — TRAZODONE HCL 50 MG PO TABS: 25.0000 mg | ORAL_TABLET | Freq: Every evening | ORAL | 1 refills | Status: DC | PRN

## 2022-05-04 NOTE — Patient Instructions (Addendum)
A few things to remember from today's visit:  Routine general medical examination at a health care facility  Hyperlipidemia, unspecified hyperlipidemia type - Plan: Comprehensive metabolic panel  Prostate cancer screening - Plan: PSA(Must document that pt has been informed of limitations of PSA testing.)  Screening for endocrine, metabolic and immunity disorder - Plan: Hemoglobin A1c  Insomnia, unspecified type - Plan: TSH, CBC, traZODone (DESYREL) 50 MG tablet  Cryptogenic stroke ([place])  Stop statin and monitor if sleep and irritability/personality changes. Fasting labs in 3 months. If still having difficulty sleeping, start Trazodone at bedtime.  If you need refills for medications you take chronically, please call your pharmacy. Do not use My Chart to request refills or for acute issues that need immediate attention. If you send a my chart message, it may take a few days to be addressed, specially if I am not in the office.  Please be sure medication list is accurate. If a new problem present, please set up appointment sooner than planned today.  Health Maintenance, Male Adopting a healthy lifestyle and getting preventive care are important in promoting health and wellness. Ask your health care provider about: The right schedule for you to have regular tests and exams. Things you can do on your own to prevent diseases and keep yourself healthy. What should I know about diet, weight, and exercise? Eat a healthy diet  Eat a diet that includes plenty of vegetables, fruits, low-fat dairy products, and lean protein. Do not eat a lot of foods that are high in solid fats, added sugars, or sodium. Maintain a healthy weight Body mass index (BMI) is a measurement that can be used to identify possible weight problems. It estimates body fat based on height and weight. Your health care provider can help determine your BMI and help you achieve or maintain a healthy weight. Get regular  exercise Get regular exercise. This is one of the most important things you can do for your health. Most adults should: Exercise for at least 150 minutes each week. The exercise should increase your heart rate and make you sweat (moderate-intensity exercise). Do strengthening exercises at least twice a week. This is in addition to the moderate-intensity exercise. Spend less time sitting. Even light physical activity can be beneficial. Watch cholesterol and blood lipids Have your blood tested for lipids and cholesterol at 68 years of age, then have this test every 5 years. You may need to have your cholesterol levels checked more often if: Your lipid or cholesterol levels are high. You are older than 68 years of age. You are at high risk for heart disease. What should I know about cancer screening? Many types of cancers can be detected early and may often be prevented. Depending on your health history and family history, you may need to have cancer screening at various ages. This may include screening for: Colorectal cancer. Prostate cancer. Skin cancer. Lung cancer. What should I know about heart disease, diabetes, and high blood pressure? Blood pressure and heart disease High blood pressure causes heart disease and increases the risk of stroke. This is more likely to develop in people who have high blood pressure readings or are overweight. Talk with your health care provider about your target blood pressure readings. Have your blood pressure checked: Every 3-5 years if you are 20-14 years of age. Every year if you are 82 years old or older. If you are between the ages of 61 and 81 and are a current or former smoker,  ask your health care provider if you should have a one-time screening for abdominal aortic aneurysm (AAA). Diabetes Have regular diabetes screenings. This checks your fasting blood sugar level. Have the screening done: Once every three years after age 78 if you are at a  normal weight and have a low risk for diabetes. More often and at a younger age if you are overweight or have a high risk for diabetes. What should I know about preventing infection? Hepatitis B If you have a higher risk for hepatitis B, you should be screened for this virus. Talk with your health care provider to find out if you are at risk for hepatitis B infection. Hepatitis C Blood testing is recommended for: Everyone born from [birth year] through 1966. Anyone with known risk factors for hepatitis C. Sexually transmitted infections (STIs) You should be screened each year for STIs, including gonorrhea and chlamydia, if: You are sexually active and are younger than 68 years of age. You are older than 68 years of age and your health care provider tells you that you are at risk for this type of infection. Your sexual activity has changed since you were last screened, and you are at increased risk for chlamydia or gonorrhea. Ask your health care provider if you are at risk. Ask your health care provider about whether you are at high risk for HIV. Your health care provider may recommend a prescription medicine to help prevent HIV infection. If you choose to take medicine to prevent HIV, you should first get tested for HIV. You should then be tested every 3 months for as long as you are taking the medicine. Follow these instructions at home: Alcohol use Do not drink alcohol if your health care provider tells you not to drink. If you drink alcohol: Limit how much you have to 0-2 drinks a day. Know how much alcohol is in your drink. In the [place] one drink equals one 12 oz bottle of beer (355 mL), one 5 oz glass of wine (148 mL), or one 1 oz glass of hard liquor (44 mL). Lifestyle Do not use any products that contain nicotine or tobacco. These products include cigarettes, chewing tobacco, and vaping devices, such as e-cigarettes. If you need help quitting, ask your health care provider. Do not use street  drugs. Do not share needles. Ask your health care provider for help if you need support or information about quitting drugs. General instructions Schedule regular health, dental, and eye exams. Stay current with your vaccines. Tell your health care provider if: You often feel depressed. You have ever been abused or do not feel safe at home. Summary Adopting a healthy lifestyle and getting preventive care are important in promoting health and wellness. Follow your health care provider's instructions about healthy diet, exercising, and getting tested or screened for diseases. Follow your health care provider's instructions on monitoring your cholesterol and blood pressure. This information is not intended to replace advice given to you by your health care provider. Make sure you discuss any questions you have with your health care provider. Document Revised: 08/31/2020 Document Reviewed: 08/31/2020 Elsevier Patient Education  [place].

## 2022-05-05 NOTE — Assessment & Plan Note (Signed)
We discussed the importance of regular physical activity and healthy diet for prevention of chronic illness and/or complications. Preventive guidelines reviewed. Vaccination up to date. Next CPE in a year. 

## 2022-05-05 NOTE — Assessment & Plan Note (Signed)
Residual short term memory difficulties. He still works full time. Extensive work up was negative. He is going to stop Atorvastatin for a couple months due to possible side effects. Continue Aspirin 81 mg daily.

## 2022-05-05 NOTE — Assessment & Plan Note (Signed)
LDL 87 in 03/2021. We discussed CV benefits of statins. He feels more irritable and some cognitive changes sicne Atorvastatin dose was increased. Today we decided to stop medication for 3 months and see if he feels better. Will plan on checking FLP in 3 months.

## 2022-05-05 NOTE — Assessment & Plan Note (Signed)
He feels like Atorvastatin is causing problem, discontinued today. Good sleep hygiene. If not better he can try Trazodone 25-50 mg at bedtime, some side effects discussed. F/U in 6 months.

## 2022-05-05 NOTE — Assessment & Plan Note (Signed)
Has had lesion for years. Recommend  derma evaluation, may need Bx. Continue monitoring for changes.

## 2022-07-03 ENCOUNTER — Other Ambulatory Visit: Payer: Self-pay | Admitting: Family Medicine

## 2022-08-03 ENCOUNTER — Other Ambulatory Visit: Payer: 59

## 2022-10-25 NOTE — Progress Notes (Signed)
HPI: [name]. is a 67 y.o. male with PMHx significant for cryptogenic stroke and HLD here today for chronic disease management.  Last seen on 05/04/22, when he was concerned about possible side effects of statin medication, Atorvastatin, memory difficulties and insomnia. Atorvastatin 80 mg was discontinued and reports that he has been feeling much better since then.  Currently he is on nonpharmacologic treatment. He has been taking a CoQ10 supplement, which he believes may be contributing to his improved well-being. He denies any chest pain, difficulty breathing, palpitations, or headaches.   Lab Results  Component Value Date   CHOL 169 04/09/2021   HDL 56.80 04/09/2021   LDLCALC 87 04/09/2021   LDLDIRECT 148.2 03/22/2006   TRIG 123.0 04/09/2021   CHOLHDL 3 04/09/2021   Insomnia:He reports that he has been taking trazodone 50 mg 0.5 tab daily at bedtime and is now sleeping an average of six hours per night. He states that he feels rested the next day and does not experience any excessive tiredness.   Review of Systems  Constitutional:  Negative for chills and fever.  HENT:  Negative for mouth sores and sore throat.   Respiratory:  Negative for cough and wheezing.   Gastrointestinal:  Negative for abdominal pain, nausea and vomiting.  Endocrine: Negative for cold intolerance and heat intolerance.  Genitourinary:  Negative for decreased urine volume, dysuria and hematuria.  Neurological:  Negative for syncope, facial asymmetry and weakness.  See other pertinent positives and negatives in HPI.  Current Outpatient Medications on File Prior to Visit  Medication Sig Dispense Refill   ASPIRIN LOW DOSE 81 MG tablet TAKE 1 TABLET BY MOUTH EVERY DAY 90 tablet 3   fluticasone (CUTIVATE) 0.05 % cream APPLY TOPICALLY 2 (TWO) TIMES DAILY AS NEEDED. 30 g 0   No current facility-administered medications on file prior to visit.   Past Medical History:  Diagnosis Date    Bell's palsy    No Known Allergies  Social History   Socioeconomic History   Marital status: Married    Spouse name: Not on file   Number of children: Not on file   Years of education: Not on file   Highest education level: Not on file  Occupational History   Not on file  Tobacco Use   Smoking status: Never   Smokeless tobacco: Never  Substance and Sexual Activity   Alcohol use: Not Currently   Drug use: Not on file   Sexual activity: Not on file  Other Topics Concern   Not on file  Social History Narrative   Not on file   Social Determinants of Health   Financial Resource Strain: Not on file  Food Insecurity: Not on file  Transportation Needs: Not on file  Physical Activity: Not on file  Stress: Not on file  Social Connections: Not on file   Vitals:   10/26/22 0659  BP: 120/70  Pulse: 60  Resp: 16  Temp: 98 F (36.7 C)  SpO2: 96%   Body mass index is 25.79 kg/m.  Physical Exam Vitals and nursing note reviewed.  Constitutional:      General: He is not in acute distress.    Appearance: He is well-developed.  HENT:     Head: Normocephalic and atraumatic.     Mouth/Throat:     Mouth: Mucous membranes are moist.     Pharynx: Oropharynx is clear.  Eyes:     Conjunctiva/sclera: Conjunctivae normal.  Cardiovascular:  Rate and Rhythm: Normal rate and regular rhythm.     Pulses:          Posterior tibial pulses are 2+ on the right side and 2+ on the left side.     Heart sounds: No murmur heard. Pulmonary:     Effort: Pulmonary effort is normal. No respiratory distress.     Breath sounds: Normal breath sounds.  Abdominal:     Palpations: Abdomen is soft. There is no hepatomegaly or mass.     Tenderness: There is no abdominal tenderness.  Musculoskeletal:     Right lower leg: No edema.     Left lower leg: No edema.  Lymphadenopathy:     Cervical: No cervical adenopathy.  Skin:    General: Skin is warm.     Findings: No erythema or rash.   Neurological:     Mental Status: He is alert and oriented to person, place, and time.     Cranial Nerves: No cranial nerve deficit.     Gait: Gait normal.  Psychiatric:        Mood and Affect: Mood and affect normal.   ASSESSMENT AND PLAN:  [name] was seen today for medical management of chronic issues.  Diagnoses and all orders for this visit: Lab Results  Component Value Date   CHOL 203 (H) 10/26/2022   HDL 53.00 10/26/2022   LDLCALC 130 (H) 10/26/2022   LDLDIRECT 148.2 03/22/2006   TRIG 99.0 10/26/2022   CHOLHDL 4 10/26/2022   Insomnia, unspecified type Assessment & Plan: Problem has improved. Continue trazodone 50 mg 1/2 tablet daily at bedtime. Continue good sleep hygiene.  Orders: -     traZODone HCl; Take 0.5 tablets (25 mg total) by mouth at bedtime as needed for sleep.  Dispense: 45 tablet; Refill: 2  Hyperlipidemia, unspecified hyperlipidemia type Assessment & Plan: We discussed CV benefits of statin medications. For now continue nonpharmacologic treatment. Depending of lipid panel results, may recommend a different statin daily or a few times per week.  Orders: -     Lipid panel; Future  Apparently, gastroenterologist office has been trying to reach him to schedule colonoscopy.  Letter printed and given to pt for him to call and schedule appt.  Return in about 29 weeks (around 05/17/2023) for CPE.  Avital Dancy G. [place], MD  [facility]. [place] office.

## 2022-10-26 ENCOUNTER — Ambulatory Visit (INDEPENDENT_AMBULATORY_CARE_PROVIDER_SITE_OTHER): Payer: 59 | Admitting: Family Medicine

## 2022-10-26 ENCOUNTER — Encounter: Payer: Self-pay | Admitting: Family Medicine

## 2022-10-26 VITALS — BP 120/70 | HR 60 | Temp 98.0°F | Resp 16 | Ht 73.0 in | Wt 195.5 lb

## 2022-10-26 DIAGNOSIS — E785 Hyperlipidemia, unspecified: Secondary | ICD-10-CM | POA: Diagnosis not present

## 2022-10-26 DIAGNOSIS — G4700 Insomnia, unspecified: Secondary | ICD-10-CM | POA: Diagnosis not present

## 2022-10-26 LAB — LIPID PANEL
Cholesterol: 203 mg/dL — ABNORMAL HIGH (ref 0–200)
HDL: 53 mg/dL (ref >39.00)
LDL Cholesterol: 130 mg/dL — ABNORMAL HIGH (ref 0–99)
NonHDL: 149.8
Total CHOL/HDL Ratio: 4
Triglycerides: 99 mg/dL (ref 0.0–149.0)
VLDL: 19.8 mg/dL (ref 0.0–40.0)

## 2022-10-26 MED ORDER — TRAZODONE HCL 50 MG PO TABS: 25.0000 mg | ORAL_TABLET | Freq: Every evening | ORAL | 2 refills | Status: DC | PRN

## 2022-10-26 NOTE — Assessment & Plan Note (Signed)
Problem has improved. Continue trazodone 50 mg 1/2 tablet daily at bedtime. Continue good sleep hygiene.

## 2022-10-26 NOTE — Patient Instructions (Addendum)
A few things to remember from today's visit:  Hyperlipidemia, unspecified hyperlipidemia type - Plan: Lipid panel  Insomnia, unspecified type - Plan: traZODone (DESYREL) 50 MG tablet  No changes today. May resume cholesterol me depending of lab results and a lower dose.  If you need refills for medications you take chronically, please call your pharmacy. Do not use My Chart to request refills or for acute issues that need immediate attention. If you send a my chart message, it may take a few days to be addressed, specially if I am not in the office.  Please be sure medication list is accurate. If a new problem present, please set up appointment sooner than planned today.

## 2022-10-26 NOTE — Assessment & Plan Note (Signed)
We discussed CV benefits of statin medications. For now continue nonpharmacologic treatment. Depending of lipid panel results, may recommend a different statin daily or a few times per week.

## 2023-08-27 ENCOUNTER — Emergency Department (HOSPITAL_COMMUNITY): Payer: Self-pay

## 2023-08-27 ENCOUNTER — Observation Stay (HOSPITAL_COMMUNITY): Payer: Self-pay

## 2023-08-27 ENCOUNTER — Observation Stay (HOSPITAL_COMMUNITY)
Admission: EM | Admit: 2023-08-27 | Discharge: 2023-08-29 | Disposition: A | Discharge status: Home / Self Care | Payer: Self-pay | Attending: Internal Medicine | Admitting: Internal Medicine

## 2023-08-27 ENCOUNTER — Other Ambulatory Visit: Payer: Self-pay

## 2023-08-27 ENCOUNTER — Encounter (HOSPITAL_COMMUNITY): Payer: Self-pay | Admitting: Emergency Medicine

## 2023-08-27 DIAGNOSIS — I6389 Other cerebral infarction: Principal | ICD-10-CM | POA: Insufficient documentation

## 2023-08-27 DIAGNOSIS — Z950 Presence of cardiac pacemaker: Secondary | ICD-10-CM | POA: Insufficient documentation

## 2023-08-27 DIAGNOSIS — E538 Deficiency of other specified B group vitamins: Secondary | ICD-10-CM | POA: Insufficient documentation

## 2023-08-27 DIAGNOSIS — E785 Hyperlipidemia, unspecified: Secondary | ICD-10-CM | POA: Diagnosis present

## 2023-08-27 DIAGNOSIS — I639 Cerebral infarction, unspecified: Principal | ICD-10-CM | POA: Diagnosis present

## 2023-08-27 DIAGNOSIS — Z7982 Long term (current) use of aspirin: Secondary | ICD-10-CM | POA: Insufficient documentation

## 2023-08-27 DIAGNOSIS — Z79899 Other long term (current) drug therapy: Secondary | ICD-10-CM | POA: Insufficient documentation

## 2023-08-27 LAB — COMPREHENSIVE METABOLIC PANEL WITH GFR
ALT: 20 U/L (ref 0–44)
AST: 22 U/L (ref 15–41)
Albumin: 3.9 g/dL (ref 3.5–5.0)
Alkaline Phosphatase: 63 U/L (ref 38–126)
Anion gap: 6 (ref 5–15)
BUN: 17 mg/dL (ref 8–23)
CO2: 26 mmol/L (ref 22–32)
Calcium: 9.3 mg/dL (ref 8.9–10.3)
Chloride: 107 mmol/L (ref 98–111)
Creatinine, Ser: 1.11 mg/dL (ref 0.61–1.24)
GFR, Estimated: >60 mL/min (ref >60)
Glucose, Bld: 98 mg/dL (ref 70–99)
Potassium: 3.9 mmol/L (ref 3.5–5.1)
Sodium: 139 mmol/L (ref 135–145)
Total Bilirubin: 0.6 mg/dL (ref 0.0–1.2)
Total Protein: 6.8 g/dL (ref 6.5–8.1)

## 2023-08-27 LAB — CBC WITH DIFFERENTIAL/PLATELET
Abs Immature Granulocytes: 0.01 10*3/uL (ref 0.00–0.07)
Basophils Absolute: 0.1 10*3/uL (ref 0.0–0.1)
Basophils Relative: 1 %
Eosinophils Absolute: 0.1 10*3/uL (ref 0.0–0.5)
Eosinophils Relative: 3 %
HCT: 43.8 % (ref 39.0–52.0)
Hemoglobin: 14.6 g/dL (ref 13.0–17.0)
Immature Granulocytes: 0 %
Lymphocytes Relative: 31 %
Lymphs Abs: 1.4 10*3/uL (ref 0.7–4.0)
MCH: 32.6 pg (ref 26.0–34.0)
MCHC: 33.3 g/dL (ref 30.0–36.0)
MCV: 97.8 fL (ref 80.0–100.0)
Monocytes Absolute: 0.6 10*3/uL (ref 0.1–1.0)
Monocytes Relative: 12 %
Neutro Abs: 2.4 10*3/uL (ref 1.7–7.7)
Neutrophils Relative %: 53 %
Platelets: 238 10*3/uL (ref 150–400)
RBC: 4.48 MIL/uL (ref 4.22–5.81)
RDW: 12.4 % (ref 11.5–15.5)
WBC: 4.5 10*3/uL (ref 4.0–10.5)
nRBC: 0 % (ref 0.0–0.2)

## 2023-08-27 LAB — ETHANOL: Alcohol, Ethyl (B): <15 mg/dL (ref <15)

## 2023-08-27 LAB — URINALYSIS, ROUTINE W REFLEX MICROSCOPIC
Bilirubin Urine: NEGATIVE
Glucose, UA: NEGATIVE mg/dL
Hgb urine dipstick: NEGATIVE
Ketones, ur: NEGATIVE mg/dL
Leukocytes,Ua: NEGATIVE
Nitrite: NEGATIVE
Protein, ur: NEGATIVE mg/dL
Specific Gravity, Urine: 1.014 (ref 1.005–1.030)
pH: 6 (ref 5.0–8.0)

## 2023-08-27 LAB — TROPONIN I (HIGH SENSITIVITY)
Troponin I (High Sensitivity): 4 ng/L (ref <18)
Troponin I (High Sensitivity): 6 ng/L (ref <18)

## 2023-08-27 MED ORDER — ACETAMINOPHEN 160 MG/5ML PO SOLN: 650.0000 mg | ORAL | Status: DC | PRN

## 2023-08-27 MED ORDER — SENNOSIDES-DOCUSATE SODIUM 8.6-50 MG PO TABS: 1.0000 | ORAL_TABLET | Freq: Every evening | ORAL | Status: DC | PRN

## 2023-08-27 MED ORDER — ASPIRIN 81 MG PO TBEC: 81.0000 mg | DELAYED_RELEASE_TABLET | Freq: Every day | ORAL | Status: DC

## 2023-08-27 MED ORDER — ACETAMINOPHEN 325 MG PO TABS
650.0000 mg | ORAL_TABLET | ORAL | Status: DC | PRN
  Filled 2023-08-27: qty 2

## 2023-08-27 MED ORDER — ACETAMINOPHEN 650 MG RE SUPP: 650.0000 mg | RECTAL | Status: DC | PRN

## 2023-08-27 MED ORDER — IOHEXOL 350 MG/ML SOLN
75.0000 mL | Freq: Once | INTRAVENOUS | Status: AC | PRN
  Administered 2023-08-27: 75 mL via INTRAVENOUS

## 2023-08-27 MED ORDER — STROKE: EARLY STAGES OF RECOVERY BOOK: Freq: Once | Status: DC

## 2023-08-27 MED ORDER — LORAZEPAM 2 MG/ML IJ SOLN
0.5000 mg | Freq: Once | INTRAMUSCULAR | Status: AC
  Administered 2023-08-27: 0.5 mg via INTRAVENOUS
  Filled 2023-08-27: qty 1

## 2023-08-27 MED ORDER — ASPIRIN 81 MG PO TBEC
81.0000 mg | DELAYED_RELEASE_TABLET | Freq: Every day | ORAL | Status: DC
  Administered 2023-08-28: 81 mg via ORAL
  Filled 2023-08-27: qty 1

## 2023-08-27 NOTE — H&P (Signed)
 History and Physical    Nuh EDWARD Musto Jr. HQI:[id number] DOB: 15-Oct-1955 DOA: 08/27/2023  PCP: [place], Betty G, MD   Patient coming from: Home   Chief Complaint: Tongue and right hand numbness, speech difficulty   HPI: Ariv EDWARD ALDOUS LANDSMAN. is a pleasant 68 y.o. male with medical history significant for cryptogenic stroke who now presents for evaluation of numbness involving his tongue and right hand, and also speech difficulty.  Patient reports that he was in his usual state and having an uneventful day when he developed acute onset of numbness involving his tongue, some of the fingers of his right hand, and also slurred speech.  He states that the symptoms resolved within a few minutes and he now feels back to normal.  He denies any change in vision or headache.  He denies any recent chest pain or palpitations.  There has not been any recent fever or chills.  ED Course: Upon arrival to the ED, patient is found to be afebrile and saturating well on room air with elevated BP.  EKG demonstrates sinus rhythm with LVH.  Labs are most notable for normal creatinine, normal WBC, normal troponin, and undetectable ethanol.  MRI brain reveals punctate acute left medial occipital lobe infarction and also possible subarachnoid hemorrhage, meningitis, or artifact along the posterior left frontal convexity, and microhemorrhages.  Neurology evaluated the patient in the emergency department.  Review of Systems:  All other systems reviewed and apart from HPI, are negative.  Past Medical History:  Diagnosis Date   Bell's palsy     Past Surgical History:  Procedure Laterality Date   BUBBLE STUDY  02/11/2019   Procedure: BUBBLE STUDY;  Surgeon: Hugh Madura, MD;  Location: [facility] ENDOSCOPY;  Service: Cardiovascular;;   LOOP RECORDER INSERTION N/A 02/11/2019   Procedure: LOOP RECORDER INSERTION;  Surgeon: Verona Goodwill, MD;  Location: [facility] INVASIVE CV LAB;  Service: Cardiovascular;  Laterality:  N/A;   TEE WITHOUT CARDIOVERSION N/A 02/11/2019   Procedure: TRANSESOPHAGEAL ECHOCARDIOGRAM (TEE);  Surgeon: Hugh Madura, MD;  Location: [facility] ENDOSCOPY;  Service: Cardiovascular;  Laterality: N/A;    Social History:   reports that he has never smoked. He has never used smokeless tobacco. He reports that he does not currently use alcohol. No history on file for drug use.  No Known Allergies  Family History  Problem Relation Age of Onset   Transient ischemic attack Mother    Transient ischemic attack Father      Prior to Admission medications   Medication Sig Start Date End Date Taking? Authorizing Provider  ASPIRIN  LOW DOSE 81 MG tablet TAKE 1 TABLET BY MOUTH EVERY DAY 07/04/22  Yes [place], Betty G, MD  co-enzyme Q-10 30 MG capsule Take 30 mg by mouth daily.   Yes [provider]  fluticasone  (CUTIVATE ) 0.05 % cream APPLY TOPICALLY 2 (TWO) TIMES DAILY AS NEEDED. Patient not taking: Reported on 08/27/2023 10/29/19   [place], Betty G, MD  traZODone  (DESYREL ) 50 MG tablet Take 0.5 tablets (25 mg total) by mouth at bedtime as needed for sleep. Patient not taking: Reported on 08/27/2023 10/26/22   [place], Betty G, MD    Physical Exam: Vitals:   08/27/23 1615 08/27/23 1618 08/27/23 1745  BP: (!) 175/97 (!) 175/97 (!) 135/96  Pulse: (!) 54  64  Resp: 18 16 15   Temp: 98.2 F (36.8 C) 98.2 F (36.8 C)   TempSrc: Oral Oral   SpO2: 98% 97% 98%    Constitutional: NAD,  no pallor or diaphoresis   Eyes: PERTLA, lids and conjunctivae normal ENMT: Mucous membranes are moist. Posterior pharynx clear of any exudate or lesions.   Neck: supple, no masses  Respiratory: no wheezing, no crackles. No accessory muscle use.  Cardiovascular: S1 & S2 heard, regular rate and rhythm. No extremity edema.  Abdomen: No distension, no tenderness, soft. Bowel sounds active.  Musculoskeletal: no clubbing / cyanosis. No joint deformity upper and lower extremities.   Skin: no significant rashes, lesions,  ulcers. Warm, dry, well-perfused. Neurologic: CN 2-12 grossly intact. Sensation intact. Strength 5/5 in all 4 limbs. Alert and oriented.  Psychiatric: Calm. Cooperative.    Labs and Imaging on Admission: I have personally reviewed following labs and imaging studies  CBC: Recent Labs  Lab 08/27/23 1624  WBC 4.5  NEUTROABS 2.4  HGB 14.6  HCT 43.8  MCV 97.8  PLT 238   Basic Metabolic Panel: Recent Labs  Lab 08/27/23 1624  NA 139  K 3.9  CL 107  CO2 26  GLUCOSE 98  BUN 17  CREATININE 1.11  CALCIUM  9.3   GFR: CrCl cannot be calculated (Unknown ideal weight.). Liver Function Tests: Recent Labs  Lab 08/27/23 1624  AST 22  ALT 20  ALKPHOS 63  BILITOT 0.6  PROT 6.8  ALBUMIN 3.9   No results for input(s): "LIPASE", "AMYLASE" in the last 168 hours. No results for input(s): "AMMONIA" in the last 168 hours. Coagulation Profile: No results for input(s): "INR", "PROTIME" in the last 168 hours. Cardiac Enzymes: No results for input(s): "CKTOTAL", "CKMB", "CKMBINDEX", "TROPONINI" in the last 168 hours. BNP (last 3 results) No results for input(s): "PROBNP" in the last 8760 hours. HbA1C: No results for input(s): "HGBA1C" in the last 72 hours. CBG: No results for input(s): "GLUCAP" in the last 168 hours. Lipid Profile: No results for input(s): "CHOL", "HDL", "LDLCALC", "TRIG", "CHOLHDL", "LDLDIRECT" in the last 72 hours. Thyroid  Function Tests: No results for input(s): "TSH", "T4TOTAL", "FREET4", "T3FREE", "THYROIDAB" in the last 72 hours. Anemia Panel: No results for input(s): "VITAMINB12", "FOLATE", "FERRITIN", "TIBC", "IRON", "RETICCTPCT" in the last 72 hours. Urine analysis:    Component Value Date/Time   COLORURINE YELLOW 08/27/2023 1618   APPEARANCEUR CLEAR 08/27/2023 1618   LABSPEC 1.014 08/27/2023 1618   PHURINE 6.0 08/27/2023 1618   GLUCOSEU NEGATIVE 08/27/2023 1618   GLUCOSEU NEGATIVE 03/22/2006 0745   HGBUR NEGATIVE 08/27/2023 1618   BILIRUBINUR  NEGATIVE 08/27/2023 1618   KETONESUR NEGATIVE 08/27/2023 1618   PROTEINUR NEGATIVE 08/27/2023 1618   UROBILINOGEN 0.2 mg/dL 47/42/5956 3875   NITRITE NEGATIVE 08/27/2023 1618   LEUKOCYTESUR NEGATIVE 08/27/2023 1618   Sepsis Labs: @LABRCNTIP (procalcitonin:4,lacticidven:4) )No results found for this or any previous visit (from the past 240 hours).   Radiological Exams on Admission: MR BRAIN WO CONTRAST Result Date: 08/27/2023 CLINICAL DATA:  Transient ischemic attack (TIA) EXAM: MRI HEAD WITHOUT CONTRAST TECHNIQUE: Multiplanar, multiecho pulse sequences of the brain and surrounding structures were obtained without intravenous contrast. COMPARISON:  None Available. FINDINGS: Brain: Punctate acute infarct in the medial left occipital lobe. No substantial mass effect. Remote small right temporal lobe infarcting chronic microvascular ischemic change. Numerous punctate chronic microhemorrhages in the left greater than right parieto-occipital regions with additional punctate microhemorrhages in the cerebellum and bilateral frontal lobes. Bilateral frontal convexity superficial siderosis. Sulcal FLAIR hyperintensity along the posterior left frontal convexity (for example see series 6, image 29). No midline shift or visible mass lesion. No hydrocephalus. Vascular: Major arterial flow voids are maintained at  the skull base. Skull and upper cervical spine: Normal marrow signal. Sinuses/Orbits: Clear sinuses.  No acute orbital findings. IMPRESSION: 1. Punctate acute left medial occipital infarct. 2. Sulcal FLAIR hyperintensity along the posterior left frontal convexity. This could represent small volume of acute/recent subarachnoid hemorrhage, meningitis, or artifact. Recommend noncontrast head CT to better evaluate for acute/hyperdense hemorrhage. Consider lumbar puncture. 3. Numerous posterior predominant chronic microhemorrhages with bilateral frontal convexity superficial siderosis. Findings raise concern for  amyloid angiopathy. Chronic hypertensive hemorrhages or vasculitis are less likely differential considerations. Findings discussed with Dr. Arora via telephone at 7:27 p.m. Electronically Signed   By: Stevenson Elbe M.D.   On: 08/27/2023 20:00    EKG: Independently reviewed. Sinus rhythm, LVH.   Assessment/Plan   1. Acute ischemic CVA   - Continue cardiac monitoring and frequent neuro checks, check echo, lipids, and A1c, consult PT/OT/SLP, continue ASA 81 daily     DVT prophylaxis: SCDs  Code Status: Full  Level of Care: Level of care: Telemetry Medical Family Communication: Wife at bedside  Disposition Plan:  Patient is from: home  Anticipated d/c is to: Home  Anticipated d/c date is: 5/5 or 08/29/23  Patient currently: pending stroke workup  Consults called: Neurology  Admission status: Observation     Walton Guppy, MD [facility]  08/27/2023, 9:19 PM

## 2023-08-27 NOTE — Consult Note (Signed)
 NEUROLOGY CONSULT NOTE   Date of service: Aug 27, 2023 Patient Name: Joshua EDWARD Mcjunkins Jr. MRN:  [id number] DOB:  [birth year] Chief Complaint: "Tongue numbness" Requesting Provider: Walton Guppy, MD  History of Present Illness  Joshua EDWARD BOBBYE SAPIA. is a 68 y.o. male with hx of prior small cortical stroke without any residual deficits with cardiac monitoring with loop recorder did not show any evidence of atrial fibrillation, now returns for evaluation of a brief 15-second episode of tongue numbness that happened earlier this morning.  Symptoms completely resolved.  MRI of the brain completed that shows a new small punctate stroke in the left hemisphere.  Neurology consult was obtained for the new stroke. Denies any ongoing symptoms of tingling numbness or weakness.  LKW: Sometime earlier this morning 11 AM. Modified rankin score: 0-Completely asymptomatic and back to baseline post- stroke IV Thrombolysis: Symptoms resolved EVT: Symptoms resolved NIH stroke scale 0   ROS  Comprehensive ROS performed and pertinent positives documented in HPI   Past History   Past Medical History:  Diagnosis Date   Bell's palsy     Past Surgical History:  Procedure Laterality Date   BUBBLE STUDY  02/11/2019   Procedure: BUBBLE STUDY;  Surgeon: Hugh Madura, MD;  Location: [facility] ENDOSCOPY;  Service: Cardiovascular;;   LOOP RECORDER INSERTION N/A 02/11/2019   Procedure: LOOP RECORDER INSERTION;  Surgeon: Verona Goodwill, MD;  Location: [facility] INVASIVE CV LAB;  Service: Cardiovascular;  Laterality: N/A;   TEE WITHOUT CARDIOVERSION N/A 02/11/2019   Procedure: TRANSESOPHAGEAL ECHOCARDIOGRAM (TEE);  Surgeon: Hugh Madura, MD;  Location: [facility] ENDOSCOPY;  Service: Cardiovascular;  Laterality: N/A;    Family History: Family History  Problem Relation Age of Onset   Transient ischemic attack Mother    Transient ischemic attack Father     Social History  reports that he has never smoked. He has  never used smokeless tobacco. He reports that he does not currently use alcohol. No history on file for drug use.  No Known Allergies  Medications   Current Facility-Administered Medications:    [START ON 08/28/2023]  stroke: early stages of recovery book, , Does not apply, Once, Opyd, Santana Cue, MD   acetaminophen  (TYLENOL ) tablet 650 mg, 650 mg, Oral, Q4H PRN **OR** acetaminophen  (TYLENOL ) 160 MG/5ML solution 650 mg, 650 mg, Per Tube, Q4H PRN **OR** acetaminophen  (TYLENOL ) suppository 650 mg, 650 mg, Rectal, Q4H PRN, Opyd, Timothy S, MD   [START ON 08/28/2023] aspirin  EC tablet 81 mg, 81 mg, Oral, Daily, Opyd, Timothy S, MD   senna-docusate (Senokot-S) tablet 1 tablet, 1 tablet, Oral, QHS PRN, Opyd, Santana Cue, MD  Current Outpatient Medications:    ASPIRIN  LOW DOSE 81 MG tablet, TAKE 1 TABLET BY MOUTH EVERY DAY, Disp: 90 tablet, Rfl: 3   co-enzyme Q-10 30 MG capsule, Take 30 mg by mouth daily., Disp: , Rfl:    fluticasone  (CUTIVATE ) 0.05 % cream, APPLY TOPICALLY 2 (TWO) TIMES DAILY AS NEEDED. (Patient not taking: Reported on 08/27/2023), Disp: 30 g, Rfl: 0   traZODone  (DESYREL ) 50 MG tablet, Take 0.5 tablets (25 mg total) by mouth at bedtime as needed for sleep. (Patient not taking: Reported on 08/27/2023), Disp: 45 tablet, Rfl: 2  Vitals   Vitals:   08/27/23 1615 08/27/23 1618 08/27/23 1745  BP: (!) 175/97 (!) 175/97 (!) 135/96  Pulse: (!) 54  64  Resp: 18 16 15   Temp: 98.2 F (36.8 C) 98.2 F (36.8 C)   TempSrc: Oral Oral  SpO2: 98% 97% 98%    There is no height or weight on file to calculate BMI.  Physical Exam  General: Awake alert in no distress HEENT: Normocephalic atraumatic Chest:Clear Cardiovascular: Regular rhythm Neurological exam Awake alert oriented x 3, no dysarthria, no aphasia, Cranial nerves II to XII intact Motor examination with no drift Sensation intact light touch Coordination examination shows no dysmetria Gait testing deferred at this  time  Labs/Imaging/Neurodiagnostic studies   CBC:  Recent Labs  Lab 09/02/23 1624  WBC 4.5  NEUTROABS 2.4  HGB 14.6  HCT 43.8  MCV 97.8  PLT 238   Basic Metabolic Panel:  Lab Results  Component Value Date   NA 139 2023/09/02   K 3.9 09-02-23   CO2 26 09/02/2023   GLUCOSE 98 Sep 02, 2023   BUN 17 02-Sep-2023   CREATININE 1.11 02-Sep-2023   CALCIUM  9.3 09-02-2023   GFRNONAA >60 09-02-2023   GFRAA 88 04/06/2020   Lipid Panel:  Lab Results  Component Value Date   LDLCALC 130 (H) 10/26/2022   HgbA1c:  Lab Results  Component Value Date   HGBA1C 5.7 05/04/2022   Urine Drug Screen: No results found for: "LABOPIA", "COCAINSCRNUR", "LABBENZ", "AMPHETMU", "THCU", "LABBARB"  Alcohol Level     Component Value Date/Time   ETH <15 02-Sep-2023 1624       MRI Brain(Personally reviewed): Punctate left medial occipital infarct.  Sulcal FLAIR hyperintensity along the posterior left frontal convexity-could represent a small acute/recent subarachnoid versus meningitis versus artifact.  Recommend noncontrast head CT.  Numerous posterior predominant chronic microhemorrhages with bilateral frontal convexity superficial siderosis/concern for amyloid versus vasculitis versus another inflammatory process.  CT head and CT angio head and neck personally reviewed No evidence of acute intracranial abnormality.  No large vessel occlusion.  The sulcal hyperintensity seen on FLAIR on MRI-does not have any kind of correlate to indicate that this is an acute subarachnoid hemorrhage   ASSESSMENT   Joshua EDWARD MCKINNEY POPLAR. is a 68 y.o. male with prior right temporal punctate infarct with no residual deficits, cardiac monitoring unremarkable for any evidence of arrhythmia, presenting for evaluation of tongue numbness lasting a few seconds, followed by an MRI of the brain that showed a punctate left medial occipital infarct. More importantly there is a subtle FLAIR hyperintensity along the posterior  left frontal convexity-unclear if it is acute/recent subarachnoid versus artifact versus meningitis.  On the SWI images there are numerous posterior predominant chronic microhemorrhages within the bilateral frontal convexity and superficial siderosis-concern for amyloid versus vasculitis. CT head and CT angio head and neck-no acute findings.  Impression: Acute ischemic stroke, evaluate for cerebral amyloid angiopathy  RECOMMENDATIONS  Admit to hospitalist For neurochecks Telemetry No evidence of acute bleed on the CT-can resume home aspirin  2D echo A1c Lipid panel PT OT Speech therapy For the next 24 to 48 hours blood pressure goal-allow permissive hypertension-treat only if systolic is greater than 220 on a as needed basis.  After that, start normalizing blood pressure for a goal blood pressure on discharge being normotension.  Avoid hypotension.  Stroke team to follow   Plan relayed to Dr. Brice Campi  ______________________________________________________________________    Signed, Tona Francis, MD [facility]

## 2023-08-27 NOTE — ED Triage Notes (Signed)
 Pt BIB by [facility] for stroke-like symptoms. Per EMS, pt reported intermittent episode of tongue numbness and slurred speech. All symptoms resolved at this time. CBG 107. Hx of stroke, only takes ASA daily.

## 2023-08-27 NOTE — ED Provider Notes (Signed)
 [place] [facility] Provider Note  CSN: [id number] Arrival date & time: 08/27/23 1612  Chief Complaint(s) Transient Ischemic Attack  HPI Joshua Mayer. is a 68 y.o. male with PMH previous cryptogenic right temporal lobe infarct as well as tiny acute infarctions over the insula and right temporoparietal junction who presents emergency room for evaluation of facial numbness and finger numbness with concern for TIA.  Patient states that he was hitting golf balls when he had sudden onset numbness of the tongue, dysarthria, confused speech and numbness of the hand on the right.  His symptoms have since resolved.  Denies associated chest pain shortness of breath, abdominal pain, nausea, vomiting or other systemic symptoms.   Past Medical History Past Medical History:  Diagnosis Date   Bell's palsy    Patient Active Problem List   Diagnosis Date Noted   Acute ischemic stroke (HCC) 08/27/2023   Melanocytic nevi of face 05/04/2022   Insomnia 05/04/2022   Routine general medical examination at a health care facility 05/04/2022   Hyperlipidemia, unspecified 03/05/2019   Cryptogenic stroke (HCC) 02/11/2019   NEPHROLITHIASIS, HX OF 08/14/2007   Home Medication(s) Prior to Admission medications   Medication Sig Start Date End Date Taking? Authorizing Provider  ASPIRIN  LOW DOSE 81 MG tablet TAKE 1 TABLET BY MOUTH EVERY DAY 07/04/22   [place], Betty G, MD  fluticasone  (CUTIVATE ) 0.05 % cream APPLY TOPICALLY 2 (TWO) TIMES DAILY AS NEEDED. 10/29/19   [place], Betty G, MD  traZODone  (DESYREL ) 50 MG tablet Take 0.5 tablets (25 mg total) by mouth at bedtime as needed for sleep. 10/26/22   [place], Betty G, MD                                                                                                                                    Past Surgical History Past Surgical History:  Procedure Laterality Date   BUBBLE STUDY  02/11/2019   Procedure: BUBBLE STUDY;   Surgeon: Hugh Madura, MD;  Location: [facility] ENDOSCOPY;  Service: Cardiovascular;;   LOOP RECORDER INSERTION N/A 02/11/2019   Procedure: LOOP RECORDER INSERTION;  Surgeon: Verona Goodwill, MD;  Location: [facility] INVASIVE CV LAB;  Service: Cardiovascular;  Laterality: N/A;   TEE WITHOUT CARDIOVERSION N/A 02/11/2019   Procedure: TRANSESOPHAGEAL ECHOCARDIOGRAM (TEE);  Surgeon: Hugh Madura, MD;  Location: [facility] ENDOSCOPY;  Service: Cardiovascular;  Laterality: N/A;   Family History Family History  Problem Relation Age of Onset   Transient ischemic attack Mother    Transient ischemic attack Father     Social History Social History   Tobacco Use   Smoking status: Never   Smokeless tobacco: Never  Substance Use Topics   Alcohol use: Not Currently   Allergies Patient has no known allergies.  Review of Systems Review of Systems  Neurological:  Positive for speech difficulty and numbness.    Physical Exam Vital Signs  I have reviewed the triage vital signs BP (!) 135/96   Pulse 64   Temp 98.2 F (36.8 C) (Oral)   Resp 15   SpO2 98%   Physical Exam Constitutional:      General: He is not in acute distress.    Appearance: Normal appearance.  HENT:     Head: Normocephalic and atraumatic.     Nose: No congestion or rhinorrhea.  Eyes:     General:        Right eye: No discharge.        Left eye: No discharge.     Extraocular Movements: Extraocular movements intact.     Pupils: Pupils are equal, round, and reactive to light.  Cardiovascular:     Rate and Rhythm: Normal rate and regular rhythm.     Heart sounds: No murmur heard. Pulmonary:     Effort: No respiratory distress.     Breath sounds: No wheezing or rales.  Abdominal:     General: There is no distension.     Tenderness: There is no abdominal tenderness.  Musculoskeletal:        General: Normal range of motion.     Cervical back: Normal range of motion.  Skin:    General: Skin is warm and dry.  Neurological:      General: No focal deficit present.     Mental Status: He is alert.     Cranial Nerves: No cranial nerve deficit.     Sensory: No sensory deficit.     Motor: No weakness.     ED Results and Treatments Labs (all labs ordered are listed, but only abnormal results are displayed) Labs Reviewed  COMPREHENSIVE METABOLIC PANEL WITH GFR  CBC WITH DIFFERENTIAL/PLATELET  ETHANOL  URINALYSIS, ROUTINE W REFLEX MICROSCOPIC  HIV ANTIBODY (ROUTINE TESTING W REFLEX)  LIPID PANEL  HEMOGLOBIN A1C  CBC  BASIC METABOLIC PANEL WITH GFR  TROPONIN I (HIGH SENSITIVITY)  TROPONIN I (HIGH SENSITIVITY)                                                                                                                          Radiology MR BRAIN WO CONTRAST Result Date: 08/27/2023 CLINICAL DATA:  Transient ischemic attack (TIA) EXAM: MRI HEAD WITHOUT CONTRAST TECHNIQUE: Multiplanar, multiecho pulse sequences of the brain and surrounding structures were obtained without intravenous contrast. COMPARISON:  None Available. FINDINGS: Brain: Punctate acute infarct in the medial left occipital lobe. No substantial mass effect. Remote small right temporal lobe infarcting chronic microvascular ischemic change. Numerous punctate chronic microhemorrhages in the left greater than right parieto-occipital regions with additional punctate microhemorrhages in the cerebellum and bilateral frontal lobes. Bilateral frontal convexity superficial siderosis. Sulcal FLAIR hyperintensity along the posterior left frontal convexity (for example see series 6, image 29). No midline shift or visible mass lesion. No hydrocephalus. Vascular: Major arterial flow voids are maintained at the skull base. Skull and upper cervical spine: Normal marrow signal. Sinuses/Orbits: Clear sinuses.  No acute orbital findings. IMPRESSION: 1. Punctate acute left medial occipital infarct. 2. Sulcal FLAIR hyperintensity along the posterior left frontal convexity. This  could represent small volume of acute/recent subarachnoid hemorrhage, meningitis, or artifact. Recommend noncontrast head CT to better evaluate for acute/hyperdense hemorrhage. Consider lumbar puncture. 3. Numerous posterior predominant chronic microhemorrhages with bilateral frontal convexity superficial siderosis. 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 Chronic hypertensive hemorrhages or vasculitis are less likely differential considerations. Findings discussed with Dr. Arora via telephone at 7:27 p.m. Electronically Signed   By: Stevenson Elbe M.D.   On: 08/27/2023 20:00    Pertinent labs & imaging results that were available during my care of the patient were reviewed by me and considered in my medical decision making (see MDM for details).  Medications Ordered in ED Medications  aspirin  EC tablet 81 mg (has no administration in time range)   stroke: early stages of recovery book (has no administration in time range)  acetaminophen  (TYLENOL ) tablet 650 mg (has no administration in time range)    Or  acetaminophen  (TYLENOL ) 160 MG/5ML solution 650 mg (has no administration in time range)    Or  acetaminophen  (TYLENOL ) suppository 650 mg (has no administration in time range)  senna-docusate (Senokot-S) tablet 1 tablet (has no administration in time range)  LORazepam  (ATIVAN ) injection 0.5 mg (0.5 mg Intravenous Given 08/27/23 1732)                                                                                                                                     Procedures Procedures  (including critical care time)  Medical Decision Making / ED Course   This patient presents to the ED for concern of speech difficulty, numbness, this involves an extensive number of treatment options, and is a complaint that carries with it a high risk of complications and morbidity.  The differential diagnosis includes CVA, hemorrhagic stroke, mass, Todd's paralysis, seizure, electrolyte  abnormality, encephalopathy, complicated migraine  MDM: Patient seen emergency room for evaluation of tongue numbness, speech difficulty and finger numbness.  Physical exam is unremarkable with no focal motor or sensory deficits.  No cranial nerve deficits.  Laboratory evaluation is unremarkable.  Due to concern for TIA, an MR brain was performed that shows a punctate acute left medial occipital infarct with a sulcal FLAIR hyperintensity in the left frontal convexity as well as numerous posterior predominant chronic microhemorrhages.  Spoke with the stroke neurologist Dr. Bonnita Buttner who is recommending medical admission for further stroke workup.  Patient admitted   Additional history obtained: -Additional history obtained from wife -External records from outside source obtained and reviewed including: Chart review including previous notes, labs, imaging, consultation notes   Lab Tests: -I ordered, reviewed, and interpreted labs.   The pertinent results include:   Labs Reviewed  COMPREHENSIVE METABOLIC PANEL WITH GFR  CBC WITH DIFFERENTIAL/PLATELET  ETHANOL  URINALYSIS, ROUTINE W REFLEX MICROSCOPIC  HIV ANTIBODY (  ROUTINE TESTING W REFLEX)  LIPID PANEL  HEMOGLOBIN A1C  CBC  BASIC METABOLIC PANEL WITH GFR  TROPONIN I (HIGH SENSITIVITY)  TROPONIN I (HIGH SENSITIVITY)      EKG   EKG Interpretation Date/Time:  Sunday Aug 27 2023 16:15:18 EDT Ventricular Rate:  54 PR Interval:  131 QRS Duration:  113 QT Interval:  433 QTC Calculation: 411 R Axis:   -1  Text Interpretation: Sinus rhythm Left ventricular hypertrophy Confirmed by Charlsie Fleeger ([id number]) on 08/27/2023 4:32:25 PM         Imaging Studies ordered: I ordered imaging studies including MRI brain I independently visualized and interpreted imaging. I agree with the radiologist interpretation   Medicines ordered and prescription drug management: Meds ordered this encounter  Medications   LORazepam  (ATIVAN ) injection 0.5 mg    aspirin  EC tablet 81 mg    stroke: early stages of recovery book   OR Linked Order Group    acetaminophen  (TYLENOL ) tablet 650 mg    acetaminophen  (TYLENOL ) 160 MG/5ML solution 650 mg    acetaminophen  (TYLENOL ) suppository 650 mg   senna-docusate (Senokot-S) tablet 1 tablet    -I have reviewed the patients home medicines and have made adjustments as needed  Critical interventions none  Consultations Obtained: I requested consultation with the stroke neurologist,  and discussed lab and imaging findings as well as pertinent plan - they recommend: Medical admission   Cardiac Monitoring: The patient was maintained on a cardiac monitor.  I personally viewed and interpreted the cardiac monitored which showed an underlying rhythm of: NSR  Social Determinants of Health:  Factors impacting patients care include: none   Reevaluation: After the interventions noted above, I reevaluated the patient and found that they have :stayed the same  Co morbidities that complicate the patient evaluation  Past Medical History:  Diagnosis Date   Bell's palsy       Dispostion: I considered admission for this patient, and given new stroke patient require hospital admission     Final Clinical Impression(s) / ED Diagnoses Final diagnoses:  None     @PCDICTATION @    Karlyn Overman, MD 08/27/23 2020

## 2023-08-28 ENCOUNTER — Observation Stay (HOSPITAL_COMMUNITY): Payer: Self-pay

## 2023-08-28 ENCOUNTER — Other Ambulatory Visit: Payer: Self-pay

## 2023-08-28 ENCOUNTER — Observation Stay (HOSPITAL_BASED_OUTPATIENT_CLINIC_OR_DEPARTMENT_OTHER): Payer: Self-pay

## 2023-08-28 DIAGNOSIS — R569 Unspecified convulsions: Secondary | ICD-10-CM

## 2023-08-28 DIAGNOSIS — Z823 Family history of stroke: Secondary | ICD-10-CM

## 2023-08-28 DIAGNOSIS — I10 Essential (primary) hypertension: Secondary | ICD-10-CM

## 2023-08-28 DIAGNOSIS — I6381 Other cerebral infarction due to occlusion or stenosis of small artery: Secondary | ICD-10-CM

## 2023-08-28 DIAGNOSIS — I6389 Other cerebral infarction: Secondary | ICD-10-CM

## 2023-08-28 DIAGNOSIS — E785 Hyperlipidemia, unspecified: Secondary | ICD-10-CM

## 2023-08-28 DIAGNOSIS — I739 Peripheral vascular disease, unspecified: Secondary | ICD-10-CM

## 2023-08-28 LAB — HEMOGLOBIN A1C
Hgb A1c MFr Bld: 5 % (ref 4.8–5.6)
Mean Plasma Glucose: 96.8 mg/dL

## 2023-08-28 LAB — CBC
HCT: 41 % (ref 39.0–52.0)
Hemoglobin: 13.8 g/dL (ref 13.0–17.0)
MCH: 32.6 pg (ref 26.0–34.0)
MCHC: 33.7 g/dL (ref 30.0–36.0)
MCV: 96.9 fL (ref 80.0–100.0)
Platelets: 224 10*3/uL (ref 150–400)
RBC: 4.23 MIL/uL (ref 4.22–5.81)
RDW: 12.7 % (ref 11.5–15.5)
WBC: 4.6 10*3/uL (ref 4.0–10.5)
nRBC: 0 % (ref 0.0–0.2)

## 2023-08-28 LAB — HIV ANTIBODY (ROUTINE TESTING W REFLEX): HIV Screen 4th Generation wRfx: NONREACTIVE

## 2023-08-28 LAB — LIPID PANEL
Cholesterol: 181 mg/dL (ref 0–200)
HDL: 47 mg/dL (ref >40)
LDL Cholesterol: 114 mg/dL — ABNORMAL HIGH (ref 0–99)
Total CHOL/HDL Ratio: 3.9 ratio
Triglycerides: 100 mg/dL (ref <150)
VLDL: 20 mg/dL (ref 0–40)

## 2023-08-28 LAB — ECHOCARDIOGRAM COMPLETE
AR max vel: 2.88 cm2
AV Area VTI: 3.69 cm2
AV Area mean vel: 3.09 cm2
AV Mean grad: 3 mmHg
AV Peak grad: 6.7 mmHg
Ao pk vel: 1.29 m/s
Area-P 1/2: 2.54 cm2
Calc EF: 66.3 %
S' Lateral: 3.3 cm
Single Plane A2C EF: 65.3 %
Single Plane A4C EF: 65.8 %

## 2023-08-28 LAB — VITAMIN B12: Vitamin B-12: 171 pg/mL — ABNORMAL LOW (ref 180–914)

## 2023-08-28 LAB — BASIC METABOLIC PANEL WITH GFR
Anion gap: 6 (ref 5–15)
BUN: 13 mg/dL (ref 8–23)
CO2: 27 mmol/L (ref 22–32)
Calcium: 8.9 mg/dL (ref 8.9–10.3)
Chloride: 107 mmol/L (ref 98–111)
Creatinine, Ser: 0.95 mg/dL (ref 0.61–1.24)
GFR, Estimated: >60 mL/min (ref >60)
Glucose, Bld: 92 mg/dL (ref 70–99)
Potassium: 3.6 mmol/L (ref 3.5–5.1)
Sodium: 140 mmol/L (ref 135–145)

## 2023-08-28 LAB — FOLATE: Folate: 15.5 ng/mL (ref >5.9)

## 2023-08-28 LAB — TSH: TSH: 1.567 u[IU]/mL (ref 0.350–4.500)

## 2023-08-28 MED ORDER — EZETIMIBE 10 MG PO TABS
10.0000 mg | ORAL_TABLET | Freq: Every day | ORAL | Status: DC
  Administered 2023-08-28 – 2023-08-29 (×2): 10 mg via ORAL
  Filled 2023-08-28 (×2): qty 1

## 2023-08-28 MED ORDER — VITAMIN B-12 1000 MCG PO TABS
1000.0000 ug | ORAL_TABLET | Freq: Every day | ORAL | Status: DC
  Administered 2023-08-28 – 2023-08-29 (×2): 1000 ug via ORAL
  Filled 2023-08-28 (×2): qty 1

## 2023-08-28 MED ORDER — ATORVASTATIN CALCIUM 40 MG PO TABS
40.0000 mg | ORAL_TABLET | Freq: Every day | ORAL | Status: DC
  Filled 2023-08-28: qty 1

## 2023-08-28 MED ORDER — CLOPIDOGREL BISULFATE 75 MG PO TABS
75.0000 mg | ORAL_TABLET | Freq: Every day | ORAL | Status: DC
  Administered 2023-08-29: 75 mg via ORAL
  Filled 2023-08-28: qty 1

## 2023-08-28 NOTE — Progress Notes (Signed)
 Progress Note   Patient: Joshua EDWARD Eichler Jr. KYH:[id number] DOB: 1955-06-10 DOA: 08/27/2023     0 DOS: the patient was seen and examined on 08/28/2023   Brief hospital course: 68 y.o. male with medical history significant for cryptogenic stroke who presented for evaluation of numbness involving his tongue and right hand, and also speech difficulty.  He was admitted as a stroke rule out.  Assessment and Plan:  Acute stroke Patient presented with transient neurological deficits. Patient is back to baseline. Patient with prior history of stroke. MRI shows punctate acute left medial occipital infarct.  Patient also had findings suggestive of recent subarachnoid hemorrhage, meningitis or artifact.  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 CTA head and neck with no evidence of acute intracranial abnormality, no large vessel occlusion or significant stenosis. Neurology consulted.  Input appreciated. TSH: Within normal limits. Vitamin B12: Low. HIV: Nonreactive. -Follow-up TTE.   - Follow-up RPR. - DC home aspirin . - Started Plavix . - Started Zetia.  Patient reports he is intolerant of statins. - Outpatient follow-up with neurology.  Vitamin B12 deficiency Vitamin B12 was 171. - Start vitamin B-12 supplementation.       Subjective: Patient is feeling well.  Physical Exam: Vitals:   08/28/23 0900 08/28/23 1105 08/28/23 1200 08/28/23 1259  BP: (!) 152/85 (!) 116/101 131/74   Pulse: (!) 58 (!) 57 (!) 55   Resp: 19 17 (!) 22   Temp: 98 F (36.7 C)   98 F (36.7 C)  TempSrc:      SpO2: 99% 97% 100%    Physical Exam   General: Alert, oriented X3  Eyes: Pupils equal, reactive  Oral cavity: moist mucous membranes  Head: Atraumatic, normocephalic  Neck: supple  Chest: clear to auscultation. No crackles, no wheezes  CVS: S1,S2 RRR. No murmurs   Abd: No distention, soft, non-tender. No masses palpable  Extr: No edema   MSK: No joint deformities or swelling  Neurological: No focal neurological deficit  Data Reviewed:     Latest Ref Rng & Units 08/28/2023    6:58 AM 08/27/2023    4:24 PM 05/04/2022   10:40 AM  CBC  WBC 4.0 - 10.5 K/uL 4.6  4.5  5.8   Hemoglobin 13.0 - 17.0 g/dL 15.1  76.1  60.7   Hematocrit 39.0 - 52.0 % 41.0  43.8  45.7   Platelets 150 - 400 K/uL 224  238  289.0       Latest Ref Rng & Units 08/28/2023    6:58 AM 08/27/2023    4:24 PM 05/04/2022   10:40 AM  BMP  Glucose 70 - 99 mg/dL 92  98  371   BUN 8 - 23 mg/dL 13  17  13    Creatinine 0.61 - 1.24 mg/dL 0.62  6.94  8.54   Sodium 135 - 145 mmol/L 140  139  143   Potassium 3.5 - 5.1 mmol/L 3.6  3.9  5.0   Chloride 98 - 111 mmol/L 107  107  103   CO2 22 - 32 mmol/L 27  26  33   Calcium  8.9 - 10.3 mg/dL 8.9  9.3  9.6      Family Communication: Wife at bedside  Disposition: Status is: Observation The patient remains OBS appropriate and will d/c before 2 midnights.  Planned Discharge Destination: Home    Time spent: 35 minutes  Author: MDALA-GAUSI, Fidela Cieslak AGATHA,  MD 08/28/2023 2:50 PM  For on call review [email or web address].

## 2023-08-28 NOTE — Progress Notes (Signed)
*  PRELIMINARY RESULTS* Echocardiogram 2D Echocardiogram has been performed.  Joshua Mayer 08/28/2023, 1:58 PM

## 2023-08-28 NOTE — Progress Notes (Signed)
 PT admitted to R/O stroke. Pt on tele NSR. BP (!) 142/79 (BP Location: Left Arm)   Pulse (!) 58   Temp 98.6 F (37 C) (Oral)   Resp 18   SpO2 97%  No skin issues. Pt oriented to room. Call bell nearby. No other needs voiced at this time. Joshua Mayer 08/28/23 7:10 PM

## 2023-08-28 NOTE — Procedures (Signed)
 Patient Name: Joshua EDWARD Garrison Jr.  MRN: [id number]  Epilepsy Attending: Arleene Lack  Referring Physician/Provider: Louretta Royals, NP  Date: 08/28/2023 Duration: 22.20 mins  Patient history:  68 y.o. male with prior right temporal punctate infarct with no residual deficits, cardiac monitoring unremarkable for any evidence of arrhythmia, presenting for evaluation of tongue numbness. EEG to evaluate for seizure  Level of alertness: Awake  AEDs during EEG study: None  Technical aspects: This EEG study was done with scalp electrodes positioned according to the 10-20 International system of electrode placement. Electrical activity was reviewed with band pass filter of 1-70Hz , sensitivity of 7 uV/mm, display speed of 61mm/sec with a 60Hz  notched filter applied as appropriate. EEG data were recorded continuously and digitally stored.  Video monitoring was available and reviewed as appropriate.  Description: The posterior dominant rhythm consists of 8 Hz activity of moderate voltage (25-35 uV) seen predominantly in posterior head regions, symmetric and reactive to eye opening and eye closing. Hyperventilation and photic stimulation were not performed.     IMPRESSION: This study is within normal limits. No seizures or epileptiform discharges were seen throughout the recording.  A normal interictal EEG does not exclude the diagnosis of epilepsy.  Grayton Lobo O Athan Casalino

## 2023-08-28 NOTE — Evaluation (Signed)
 Occupational Therapy Evaluation Patient Details Name: Joshua EDWARD Brunette Jr. MRN: [id number] DOB: [birth year] Today's Date: 08/28/2023   History of Present Illness   68 y.o. male with medical history significant for cryptogenic stroke who now presents for evaluation of numbness involving his tongue. right hand, and also speech difficulty.     Clinical Impressions Patient admitted for the diagnosis above.  PTA he lives at home with his spouse, and needed no assist with any aspect of ADL,iAD or mobility.  Patient is at his baseline, and needs no acute or post acute OT.  OT advised PT that he has no acute PT needs.  OT will sign off.  Transfer OT order may appear, but do not anticipate any additional OT needs.       If plan is discharge home, recommend the following:   Assist for transportation     Functional Status Assessment   Patient has not had a recent decline in their functional status     Equipment Recommendations   None recommended by OT     Recommendations for Other Services         Precautions/Restrictions   Precautions Precautions: None Restrictions Weight Bearing Restrictions Per Provider Order: No     Mobility Bed Mobility Overal bed mobility: Independent                  Transfers Overall transfer level: Independent                        Balance Overall balance assessment: No apparent balance deficits (not formally assessed)                                         ADL either performed or assessed with clinical judgement   ADL Overall ADL's : Independent                                             Vision Patient Visual Report: No change from baseline       Perception Perception: Not tested       Praxis Praxis: Not tested       Pertinent Vitals/Pain Pain Assessment Pain Assessment: No/denies pain     Extremity/Trunk Assessment Upper Extremity Assessment Upper Extremity  Assessment: Overall WFL for tasks assessed   Lower Extremity Assessment Lower Extremity Assessment: Overall WFL for tasks assessed   Cervical / Trunk Assessment Cervical / Trunk Assessment: Normal   Communication Communication Communication: No apparent difficulties   Cognition Arousal: Alert Behavior During Therapy: WFL for tasks assessed/performed Cognition: No apparent impairments                               Following commands: Intact       Cueing  General Comments   Cueing Techniques: Verbal cues      Exercises     Shoulder Instructions      Home Living Family/patient expects to be discharged to:: Private residence Living Arrangements: Spouse/significant other Available Help at Discharge: Family;Available 24 hours/day Type of Home: House Home Access: Stairs to enter [employer] of Steps: 3-4 Entrance Stairs-Rails: None Home Layout: One level     Bathroom Shower/Tub: Tub/shower unit  Bathroom Toilet: Standard     Home Equipment: None          Prior Functioning/Environment Prior Level of Function : Independent/Modified Independent;Driving                    OT Problem List: Decreased strength   OT Treatment/Interventions:        OT Goals(Current goals can be found in the care plan section)   Acute Rehab OT Goals Patient Stated Goal: Return home OT Goal Formulation: With patient Time For Goal Achievement: 09/01/23 Potential to Achieve Goals: Good   OT Frequency:       Co-evaluation              AM-PAC OT "6 Clicks" Daily Activity     Outcome Measure Help from another person eating meals?: None Help from another person taking care of personal grooming?: None Help from another person toileting, which includes using toliet, bedpan, or urinal?: None Help from another person bathing (including washing, rinsing, drying)?: None Help from another person to put on and taking off regular upper body  clothing?: None Help from another person to put on and taking off regular lower body clothing?: None 6 Click Score: 24   End of Session Nurse Communication: Mobility status  Activity Tolerance: Patient tolerated treatment well Patient left: in bed;with call bell/phone within reach;with family/visitor present  OT Visit Diagnosis: Unsteadiness on feet (R26.81)                Time: 7846-9629 OT Time Calculation (min): 19 min Charges:  OT General Charges $OT Visit: 1 Visit OT Evaluation $OT Eval Moderate Complexity: 1 Mod  08/28/2023  RP, OTR/L  Acute Rehabilitation Services  Office:  [phone]   Joshua Mayer 08/28/2023, 9:36 AM

## 2023-08-28 NOTE — Progress Notes (Signed)
 PT Cancellation Note  Patient Details Name: Joshua EDWARD Bingley Jr. MRN: [id number] DOB: 1956-02-13   Cancelled Treatment:    Reason Eval/Treat Not Completed: PT screened, no needs identified, will sign off Per OT, pt has returned to baseline. No acute PT needs at this time. PT to complete current orders, please re-consult if new need arise.  Emaline Handsome, PT, DPT 08/28/23, 9:05 AM   Venetta Gill 08/28/2023, 9:05 AM

## 2023-08-28 NOTE — Progress Notes (Signed)
 EEG completed. Results pending

## 2023-08-28 NOTE — Progress Notes (Signed)
 STROKE TEAM PROGRESS NOTE   BRIEF HPI [name]. is a 68 y.o. 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  SIGNIFICANT HOSPITAL EVENTS 5/4: - Presented to ED with complete resolution of symptoms.   - MRI brain with punctate acute left medial occipital infarct.  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   - CTA head and neck without acute intracranial abnormality, no LVO or proximal hemodynamically significant stenosis.  INTERIM HISTORY/SUBJECTIVE No acute overnight events, wife and daughter at bedside Patient's wife and daughter claimed that patient has had some memory deficits recently as well as more tired than usual.  They also endorse statin intolerance with discontinuation of statin in the past year or so due to side effects of mental fogginess.  Further history obtained at bedside. Patient reports yesterday while hitting of balls in the backyard, his tongue became numb and he had slurred speech with the 3rd and 4th digits of the right hand numbness and confusion that lasted approximately 5 minutes before resolution.  Patient had a similar episode about 5 years ago when he was diagnosed with an ischemic stroke.  A loop recorder was placed at that time but without arrhythmia on interrogation.  OBJECTIVE CBC    Component Value Date/Time   WBC 4.6 08/28/2023 0658   RBC 4.23 08/28/2023 0658   HGB 13.8 08/28/2023 0658   HCT 41.0 08/28/2023 0658   PLT 224 08/28/2023 0658   MCV 96.9 08/28/2023 0658   MCH 32.6 08/28/2023 0658   MCHC 33.7 08/28/2023 0658   RDW 12.7 08/28/2023 0658   LYMPHSABS 1.4 08/27/2023 1624   MONOABS 0.6 08/27/2023 1624   EOSABS 0.1 08/27/2023 1624    BASOSABS 0.1 08/27/2023 1624   BMET    Component Value Date/Time   NA 140 08/28/2023 0658   K 3.6 08/28/2023 0658   CL 107 08/28/2023 0658   CO2 27 08/28/2023 0658   GLUCOSE 92 08/28/2023 0658   GLUCOSE 101 (H) 03/22/2006 0745   BUN 13 08/28/2023 0658   CREATININE 0.95 08/28/2023 0658   CREATININE 1.03 04/06/2020 0835   CALCIUM  8.9 08/28/2023 0658   GFRNONAA >60 08/28/2023 0658   GFRNONAA 76 04/06/2020 0835   Lab Results  Component Value Date   HGBA1C 5.0 08/28/2023   Lab Results  Component Value Date   CHOL 181 08/28/2023   HDL 47 08/28/2023   LDLCALC 114 (H) 08/28/2023   LDLDIRECT 148.2 03/22/2006   TRIG 100 08/28/2023   CHOLHDL 3.9 08/28/2023   IMAGING past 24 hours CT ANGIO HEAD NECK W WO CM Result Date: 08/27/2023 CLINICAL DATA:  Stroke/TIA, determine embolic source EXAM: CT ANGIOGRAPHY HEAD AND NECK WITH AND WITHOUT CONTRAST TECHNIQUE: Multidetector CT imaging of the head and neck was performed using the standard protocol during bolus administration of intravenous contrast. Multiplanar CT image reconstructions and MIPs were obtained to evaluate the vascular anatomy. Carotid stenosis measurements (when applicable) are obtained utilizing NASCET criteria, using the distal internal carotid diameter as the denominator. RADIATION DOSE REDUCTION: This exam was performed according to the departmental dose-optimization program which includes automated exposure control, adjustment of the mA and/or kV according to patient size and/or use of iterative reconstruction technique. CONTRAST:  75mL OMNIPAQUE  IOHEXOL  350 MG/ML SOLN COMPARISON:  None Available. FINDINGS:  CT HEAD FINDINGS Brain: No evidence of acute infarction, hemorrhage, hydrocephalus, extra-axial collection or mass lesion/mass effect. Vascular: See below. Skull: No acute fracture. Sinuses/Orbits: Clear sinuses.  No acute orbital findings. Other: No mastoid effusions. Review of the MIP images confirms the above findings CTA  NECK FINDINGS Aortic arch: Great vessel origins are patent without significant stenosis. Right carotid system: No evidence of dissection, stenosis (50% or greater), or occlusion. Left carotid system: No evidence of dissection, stenosis (50% or greater), or occlusion. Vertebral arteries: Left dominant. Poor visualization of the proximal left vertebral artery due to streak artifact. Within this limitation, no evidence of dissection, stenosis (50% or greater), or occlusion. Skeleton: No acute abnormality on limited assessment. Other neck: No acute abnormality on limited assessment. Upper chest: Visualized lung apices are clear. Review of the MIP images confirms the above findings CTA HEAD FINDINGS Anterior circulation: Bilateral intracranial ICAs, MCAs, and ACAs are patent without proximal hemodynamically significant stenosis. Posterior circulation: Bilateral intradural vertebral arteries, basilar artery and bilateral posterior cerebral arteries are patent without proximal hemodynamically significant stenosis. Venous sinuses: As permitted by contrast timing, patent. Review of the MIP images confirms the above findings IMPRESSION: 1. No evidence of acute intracranial abnormality. 2. No large vessel occlusion or proximal hemodynamically significant stenosis. Electronically Signed   By: Stevenson Elbe M.D.   On: 08/27/2023 22:33   MR BRAIN WO CONTRAST Result Date: 08/27/2023 CLINICAL DATA:  Transient ischemic attack (TIA) EXAM: MRI HEAD WITHOUT CONTRAST TECHNIQUE: Multiplanar, multiecho pulse sequences of the brain and surrounding structures were obtained without intravenous contrast. COMPARISON:  None Available. FINDINGS: Brain: Punctate acute infarct in the medial left occipital lobe. No substantial mass effect. Remote small right temporal lobe infarcting chronic microvascular ischemic change. Numerous punctate chronic microhemorrhages in the left greater than right parieto-occipital regions with additional  punctate microhemorrhages in the cerebellum and bilateral frontal lobes. Bilateral frontal convexity superficial siderosis. Sulcal FLAIR hyperintensity along the posterior left frontal convexity (for example see series 6, image 29). No midline shift or visible mass lesion. No hydrocephalus. Vascular: Major arterial flow voids are maintained at the skull base. Skull and upper cervical spine: Normal marrow signal. Sinuses/Orbits: Clear sinuses.  No acute orbital findings. IMPRESSION: 1. Punctate acute left medial occipital infarct. 2. Sulcal FLAIR hyperintensity along the posterior left frontal convexity. This could represent small volume of acute/recent subarachnoid hemorrhage, meningitis, or artifact. Recommend noncontrast head CT to better evaluate for acute/hyperdense hemorrhage. Consider lumbar puncture. 3. Numerous posterior predominant chronic microhemorrhages with bilateral frontal convexity superficial siderosis. 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 Chronic hypertensive hemorrhages or vasculitis are less likely differential considerations. Findings discussed with Dr. Arora via telephone at 7:27 p.m. Electronically Signed   By: Stevenson Elbe M.D.   On: 08/27/2023 20:00   Vitals:   08/28/23 0400 08/28/23 0500 08/28/23 0530 08/28/23 0600  BP: 127/76 (!) 118/92  128/89  Pulse: (!) 46 (!) 46 (!) 45 (!) 50  Resp:   14 13  Temp:      TempSrc:      SpO2: 100% 100% 97% 100%   PHYSICAL EXAM General:  Alert, well-nourished, well-developed patient in no acute distress Psych:  Mood and affect appropriate for situation.  Patient is calm cooperative with exam. CV: Sinus bradycardia cardia on monitor. Respiratory:  Regular, unlabored respirations on room air GI: Abdomen soft and nontender  NEURO:  Mental Status: AA&Ox3, patient is able to give clear and coherent history of presentation.   Speech/Language: speech is without dysarthria  or aphasia.  Naming, repetition, and fluency are intact.   He is able to do simple addition calculations but has impaired recall only able to name 5 four-legged animals with 1 repetition.  Immediate recall of 3 objects intact, 0 recall at 2 minutes.  Clock drawing is intact.  Cranial Nerves:  II: PERRL. Visual fields full.  III, IV, VI: EOMI. Eyelids elevate symmetrically.  V: Sensation is intact to light touch and symmetrical to face.  VII: Face is symmetrical resting and with movement VIII: Hearing is intact to voice. IX, X: Phonation is normal.  XI: Shoulders shrug symmetrically XII: Tongue protrudes midline Motor: 5/5 strength to all muscle groups tested without unilateral weakness or asymmetry. Tone: is normal and bulk is normal Sensation: Intact to light touch bilaterally. Extinction absent to light touch to DSS.   Coordination: FTN intact bilaterally, HKS: no ataxia in BLE. No drift.  Gait: Deferred  ASSESSMENT/PLAN Punctate left occipital lacunar infarction, likely an incidental stroke finding as symptoms do not correlate with area of infarct Etiology:  likely small vessel disease   CTA head & neck: No evidence of acute intracranial abnormality.  No LVO or proximal hemodynamically significant stenosis. MRI punctate acute left medial occipital infarct.  A sulcal FLAIR hyperintensity along the posterior left frontal convexity.  This could represent small volume of acute/recent subarachnoid hemorrhage, meningitis, or artifact.  Recommend noncontrast head CT to better evaluate for acute/hyperdense hemorrhage.  Consider lumbar puncture.  Numerous posterior predominant chronic microhemorrhages with bilateral frontal convexity superficial siderosis.  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  Chronic hypertensive hemorrhages or vasculitis are less likely differential considerations. 2D Echo ordered, pending   LDL 114 HgbA1c 5.0 VTE prophylaxis - SCDs aspirin  81 mg daily prior to admission, now on clopidogrel  75 mg daily. Will not initiate  DAPT at this time as there is some imaging findings concerning for CAA and microhemorrhages.  Therapy recommendations:  No follow up needed  Disposition:  pending  Hx of Stroke/TIA Seen in October 2020 with fluctuating but transient left extremity weakness and numbness 02/09/2019 MRI brain with punctate acute infarct of the lateral right temporal lobe with remote tiny acute infarcts along the surface of the insula and at the right temporoparietal junction concerning for embolic infarctions in the right MCA territory Discharged on statin and 3 weeks of DAPT followed by aspirin  monotherapy Loop recorder placed without evidence of arrhythmia Echocardiogram 02/08/2019 LVEF 60 to 65% Reported residual mild short-term memory impairment  Hypertension Home meds: None Stable Blood Pressure Goal: SBP less than 160   Hyperlipidemia Home meds:  none LDL 114, goal < 70 Add Zetia 10 mg PO daily due to previous statin intolerance Can consider injectable medications on an outpatient basis if Zetia does not effectively lower LDL  Other Stroke Risk Factors Family hx stroke (mother and father)  Other Active Problems Concern for 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 Agree with note above.  He presented with transient 15-minute episode of tongue paresthesias and numbness, speech  difficulties and right hand paresthesias with MRI scan showing tiny punctate left  parietal periatrial white matter lacunar infarct likely from small vessel disease.  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  He does have mild cognitive impairment hence recommend lab work for evaluation for reversible causes of memory loss, EEG and outpatient follow-up for cognitive impairment.  Recommend Plavix  alone for stroke prevention and aggressive risk factor modification.  Long discussion with patient and wife and answered questions.  Greater than 50% time during this 50-minute visit was spent in counseling and coordination of care and discussion patient and wife care team and answering questions.D/W Dr Madaline Scales  Ardella Beaver, MD Medical Director [facility] Pager: [phone] 08/28/2023 3:07 PM   To contact Stroke Continuity provider, please refer to [email or web address]. After hours, contact General Neurology

## 2023-08-29 ENCOUNTER — Other Ambulatory Visit: Payer: Self-pay | Admitting: Internal Medicine

## 2023-08-29 DIAGNOSIS — G3184 Mild cognitive impairment, so stated: Secondary | ICD-10-CM

## 2023-08-29 DIAGNOSIS — R9089 Other abnormal findings on diagnostic imaging of central nervous system: Secondary | ICD-10-CM

## 2023-08-29 LAB — RPR: RPR Ser Ql: NONREACTIVE

## 2023-08-29 MED ORDER — CLOPIDOGREL BISULFATE 75 MG PO TABS: 75.0000 mg | ORAL_TABLET | Freq: Every day | ORAL | 11 refills | Status: DC

## 2023-08-29 MED ORDER — CYANOCOBALAMIN 1000 MCG PO TABS
1000.0000 ug | ORAL_TABLET | Freq: Every day | ORAL | 11 refills | Status: DC
Start: 2023-08-30 — End: 2024-02-22

## 2023-08-29 MED ORDER — CYANOCOBALAMIN 1000 MCG PO TABS: 1000.0000 ug | ORAL_TABLET | Freq: Every day | ORAL | 11 refills | Status: DC

## 2023-08-29 MED ORDER — EZETIMIBE 10 MG PO TABS: 10.0000 mg | ORAL_TABLET | Freq: Every day | ORAL | 11 refills | Status: DC

## 2023-08-29 MED ORDER — CLOPIDOGREL BISULFATE 75 MG PO TABS: 75.0000 mg | ORAL_TABLET | Freq: Every day | ORAL | 11 refills | Status: AC

## 2023-08-29 MED ORDER — EZETIMIBE 10 MG PO TABS: 10.0000 mg | ORAL_TABLET | Freq: Every day | ORAL | 11 refills | Status: AC

## 2023-08-29 NOTE — TOC Transition Note (Signed)
 Transition of Care [facility]) - Discharge Note   Patient Details  Name: Joshua EDWARD Jake Jr. MRN: [id number] Date of Birth: 06/22/1955  Transition of Care [facility]) CM/SW Contact:  Jonathan Neighbor, RN Phone Number: 08/29/2023, 11:22 AM   Clinical Narrative:     Pt is discharging home with self care. Insurance information sent to admitting.  Pt has transportation home.   Final next level of care: Home/Self Care Barriers to Discharge: No Barriers Identified   Patient Goals and CMS Choice            Discharge Placement                       Discharge Plan and Services Additional resources added to the After Visit Summary for                                       Social Drivers of Health (SDOH) Interventions SDOH Screenings   Food Insecurity: No Food Insecurity (08/28/2023)  Housing: Low Risk  (08/28/2023)  Transportation Needs: No Transportation Needs (08/28/2023)  Utilities: Not At Risk (08/28/2023)  Depression (PHQ2-9): Low Risk  (10/26/2022)  Social Connections: Unknown (08/28/2023)  Tobacco Use: Low Risk  (08/27/2023)     Readmission Risk Interventions     No data to display

## 2023-08-29 NOTE — Discharge Summary (Signed)
 Physician Discharge Summary   Patient: Joshua EDWARD Guerrier Jr. MRN: [id number] DOB: Feb 22, 1956  Admit date:     08/27/2023  Discharge date: 08/29/23  Discharge Physician: MDALA-GAUSI, Jilda Most   PCP: [place], Betty G, MD   Recommendations at discharge:   Follow-up with neurology  Discharge Diagnoses: Principal Problem:   Acute ischemic stroke [facility]) Active Problems:   Hyperlipidemia, unspecified  Resolved Problems:   * No resolved hospital problems. *  Hospital Course: 68 year old male with PMH of prior CVA who presented for evaluation of numbness involving his tongue and right hand, and also speech difficulty.  He was admitted as a stroke rule out.   The hospital course is in problem-based format below:    Acute stroke Patient presented with transient neurological deficits, which quickly resolved. Patient with prior history of stroke. MRI showed punctate acute left medial occipital infarct.  Patient also had findings suggestive of recent subarachnoid hemorrhage, meningitis or artifact.  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 CTA head and neck with no evidence of acute intracranial abnormality, no large vessel occlusion or significant stenosis. Neurology was consulted.   TSH: Within normal limits. Vitamin B12: Low. HIV: Nonreactive. RPR: Nonreactive. EEG: No seizures or epileptiform discharges.  TTE revealed EF 60-65%, no RWMAs, no evidence of shunt  Neurology recommended that the patient start taking Plavix  and discontinue aspirin  (avoiding DAPT due to noted intracranial microhemorrhages). The patient was also started on Zetia. He is to follow-up with neurology as outpatient.  Vitamin B12 deficiency Vitamin B12 was 171. He was started on vitamin B-12 supplementation.        Consultants: Neurology Procedures performed: EEG   Disposition: Home Diet recommendation:  Discharge Diet Orders (From admission, onward)     Start     Ordered   08/29/23 0000  Diet - low sodium heart healthy        08/29/23 1006           Regular diet DISCHARGE MEDICATION: Allergies as of 08/29/2023   No Known Allergies      Medication List     STOP taking these medications    Aspirin  Low Dose 81 MG tablet Generic drug: aspirin  EC   traZODone  50 MG tablet Commonly known as: DESYREL        TAKE these medications    clopidogrel  75 MG tablet Commonly known as: PLAVIX  Take 1 tablet (75 mg total) by mouth daily. Start taking on: Aug 30, 2023   co-enzyme Q-10 30 MG capsule Take 30 mg by mouth daily.   cyanocobalamin 1000 MCG tablet Take 1 tablet (1,000 mcg total) by mouth daily. Start taking on: Aug 30, 2023   ezetimibe 10 MG tablet Commonly known as: ZETIA Take 1 tablet (10 mg total) by mouth daily. Start taking on: Aug 30, 2023   fluticasone  0.05 % cream Commonly known as: CUTIVATE  APPLY TOPICALLY 2 (TWO) TIMES DAILY AS NEEDED.        Discharge Exam:  Physical Exam on Day of Discharge   General: Alert, cheerful, oriented X3  Oral cavity: moist mucous membranes  Neck: supple  Chest: clear to auscultation. No crackles, no wheezes  CVS: S1,S2 RRR. No murmurs  Abd: No distention, soft, non-tender. No masses palpable  Extr: No edema    Condition at discharge: good  The results of significant diagnostics from this hospitalization (including imaging, microbiology, ancillary and laboratory) are listed below for reference.  Imaging Studies: ECHOCARDIOGRAM COMPLETE Result Date: 08/28/2023    ECHOCARDIOGRAM REPORT   Patient Name:   [facility] Date of Exam: 08/28/2023 Medical Rec #:  [id number]                 Height:       73.0 in Accession #:    [id number]                Weight:       195.5 lb Date of Birth:  05/25/1955                 BSA:          2.131 m Patient Age:    67 years                   BP:           116/101 mmHg Patient Gender: M                         HR:           55 bpm. Exam Location:  Inpatient Procedure: 2D Echo, Cardiac Doppler and Color Doppler (Both Spectral and Color            Flow Doppler were utilized during procedure). Indications:    Stroke  History:        Patient has prior history of Echocardiogram examinations, most                 recent 02/08/2019. Stroke.  Sonographer:    Andrena Bang Referring Phys: Jacklynn Mask, S IMPRESSIONS  1. Left ventricular ejection fraction, by estimation, is 60 to 65%. The left ventricle has normal function. The left ventricle has no regional wall motion abnormalities. Left ventricular diastolic parameters were normal.  2. Right ventricular systolic function is normal. The right ventricular size is normal.  3. The mitral valve is grossly normal. Trivial mitral valve regurgitation. No evidence of mitral stenosis.  4. The aortic valve is tricuspid. Aortic valve regurgitation is not visualized. No aortic stenosis is present.  5. The inferior vena cava is normal in size with greater than 50% respiratory variability, suggesting right atrial pressure of 3 mmHg. Comparison(s): No significant change from prior study. Conclusion(s)/Recommendation(s): Normal biventricular function without evidence of hemodynamically significant valvular heart disease. FINDINGS  Left Ventricle: Left ventricular ejection fraction, by estimation, is 60 to 65%. The left ventricle has normal function. The left ventricle has no regional wall motion abnormalities. The left ventricular internal cavity size was normal in size. There is  borderline left ventricular hypertrophy. Left ventricular diastolic parameters were normal. Right Ventricle: The right ventricular size is normal. Right vetricular wall thickness was not well visualized. Right ventricular systolic function is normal. Left Atrium: Left atrial size was normal in size. Right Atrium: Right atrial size was normal in  size. Pericardium: There is no evidence of pericardial effusion. Mitral Valve: The mitral valve is grossly normal. Trivial mitral valve regurgitation. No evidence of mitral valve stenosis. Tricuspid Valve: The tricuspid valve is normal in structure. Tricuspid valve regurgitation is trivial. No evidence of tricuspid stenosis. Aortic Valve: The aortic valve is tricuspid. Aortic valve regurgitation is not visualized. No aortic stenosis is present. Aortic valve mean gradient measures 3.0 mmHg. Aortic valve peak gradient measures 6.7 mmHg. Aortic valve area, by VTI measures 3.69 cm. Pulmonic Valve: The pulmonic valve was not well visualized. Pulmonic valve regurgitation is trivial.  No evidence of pulmonic stenosis. Aorta: The aortic root, ascending aorta, aortic arch and descending aorta are all structurally normal, with no evidence of dilitation or obstruction. Ascending aorta measurements are within normal limits for age when indexed to body surface area. Venous: The inferior vena cava is normal in size with greater than 50% respiratory variability, suggesting right atrial pressure of 3 mmHg. IAS/Shunts: The atrial septum is grossly normal.  LEFT VENTRICLE PLAX 2D LVIDd:         5.20 cm      Diastology LVIDs:         3.30 cm      LV e' medial:    7.07 cm/s LV PW:         0.90 cm      LV E/e' medial:  9.2 LV IVS:        1.20 cm      LV e' lateral:   11.00 cm/s LVOT diam:     2.40 cm      LV E/e' lateral: 5.9 LV SV:         88 LV SV Index:   41 LVOT Area:     4.52 cm  LV Volumes (MOD) LV vol d, MOD A2C: 120.0 ml LV vol d, MOD A4C: 89.0 ml LV vol s, MOD A2C: 41.6 ml LV vol s, MOD A4C: 30.4 ml LV SV MOD A2C:     78.4 ml LV SV MOD A4C:     89.0 ml LV SV MOD BP:      69.9 ml RIGHT VENTRICLE RV S prime:     12.30 cm/s TAPSE (M-mode): 2.4 cm LEFT ATRIUM             Index LA diam:        4.30 cm 2.02 cm/m LA Vol (A2C):   36.0 ml 16.89 ml/m LA Vol (A4C):   36.4 ml 17.08 ml/m LA Biplane Vol: 35.8 ml 16.80 ml/m  AORTIC VALVE  AV Area (Vmax):    2.88 cm AV Area (Vmean):   3.09 cm AV Area (VTI):     3.69 cm AV Vmax:           129.00 cm/s AV Vmean:          73.400 cm/s AV VTI:            0.239 m AV Peak Grad:      6.7 mmHg AV Mean Grad:      3.0 mmHg LVOT Vmax:         82.20 cm/s LVOT Vmean:        50.100 cm/s LVOT VTI:          0.195 m LVOT/AV VTI ratio: 0.82  AORTA Ao Asc diam: 4.00 cm MITRAL VALVE MV Area (PHT): 2.54 cm    SHUNTS MV Decel Time: 299 msec    Systemic VTI:  0.20 m MV E velocity: 65.00 cm/s  Systemic Diam: 2.40 cm MV A velocity: 79.70 cm/s MV E/A ratio:  0.82 Sheryle Donning MD Electronically signed by Sheryle Donning MD Signature Date/Time: 08/28/2023/6:37:54 PM    Final    EEG adult Result Date: 08/28/2023 Arleene Lack, MD     08/28/2023 11:54 AM Patient Name: Joshua Ann Jason Mess. MRN: [id number] Epilepsy Attending: Arleene Lack Referring Physician/Provider: Louretta Royals, NP Date: 08/28/2023 Duration: 22.20 mins Patient history:  68 y.o. male with prior right temporal punctate infarct with no residual deficits, cardiac monitoring unremarkable for any evidence of arrhythmia,  presenting for evaluation of tongue numbness. EEG to evaluate for seizure Level of alertness: Awake AEDs during EEG study: None Technical aspects: This EEG study was done with scalp electrodes positioned according to the 10-20 International system of electrode placement. Electrical activity was reviewed with band pass filter of 1-70Hz , sensitivity of 7 uV/mm, display speed of 83mm/sec with a 60Hz  notched filter applied as appropriate. EEG data were recorded continuously and digitally stored.  Video monitoring was available and reviewed as appropriate. Description: The posterior dominant rhythm consists of 8 Hz activity of moderate voltage (25-35 uV) seen predominantly in posterior head regions, symmetric and reactive to eye opening and eye closing. Hyperventilation and photic stimulation were not performed.   IMPRESSION:  This study is within normal limits. No seizures or epileptiform discharges were seen throughout the recording. A normal interictal EEG does not exclude the diagnosis of epilepsy. Arleene Lack   CT ANGIO HEAD NECK W WO CM Result Date: 08/27/2023 CLINICAL DATA:  Stroke/TIA, determine embolic source EXAM: CT ANGIOGRAPHY HEAD AND NECK WITH AND WITHOUT CONTRAST TECHNIQUE: Multidetector CT imaging of the head and neck was performed using the standard protocol during bolus administration of intravenous contrast. Multiplanar CT image reconstructions and MIPs were obtained to evaluate the vascular anatomy. Carotid stenosis measurements (when applicable) are obtained utilizing NASCET criteria, using the distal internal carotid diameter as the denominator. RADIATION DOSE REDUCTION: This exam was performed according to the departmental dose-optimization program which includes automated exposure control, adjustment of the mA and/or kV according to patient size and/or use of iterative reconstruction technique. CONTRAST:  75mL OMNIPAQUE  IOHEXOL  350 MG/ML SOLN COMPARISON:  None Available. FINDINGS: CT HEAD FINDINGS Brain: No evidence of acute infarction, hemorrhage, hydrocephalus, extra-axial collection or mass lesion/mass effect. Vascular: See below. Skull: No acute fracture. Sinuses/Orbits: Clear sinuses.  No acute orbital findings. Other: No mastoid effusions. Review of the MIP images confirms the above findings CTA NECK FINDINGS Aortic arch: Great vessel origins are patent without significant stenosis. Right carotid system: No evidence of dissection, stenosis (50% or greater), or occlusion. Left carotid system: No evidence of dissection, stenosis (50% or greater), or occlusion. Vertebral arteries: Left dominant. Poor visualization of the proximal left vertebral artery due to streak artifact. Within this limitation, no evidence of dissection, stenosis (50% or greater), or occlusion. Skeleton: No acute abnormality on  limited assessment. Other neck: No acute abnormality on limited assessment. Upper chest: Visualized lung apices are clear. Review of the MIP images confirms the above findings CTA HEAD FINDINGS Anterior circulation: Bilateral intracranial ICAs, MCAs, and ACAs are patent without proximal hemodynamically significant stenosis. Posterior circulation: Bilateral intradural vertebral arteries, basilar artery and bilateral posterior cerebral arteries are patent without proximal hemodynamically significant stenosis. Venous sinuses: As permitted by contrast timing, patent. Review of the MIP images confirms the above findings IMPRESSION: 1. No evidence of acute intracranial abnormality. 2. No large vessel occlusion or proximal hemodynamically significant stenosis. Electronically Signed   By: Stevenson Elbe M.D.   On: 08/27/2023 22:33   MR BRAIN WO CONTRAST Result Date: 08/27/2023 CLINICAL DATA:  Transient ischemic attack (TIA) EXAM: MRI HEAD WITHOUT CONTRAST TECHNIQUE: Multiplanar, multiecho pulse sequences of the brain and surrounding structures were obtained without intravenous contrast. COMPARISON:  None Available. FINDINGS: Brain: Punctate acute infarct in the medial left occipital lobe. No substantial mass effect. Remote small right temporal lobe infarcting chronic microvascular ischemic change. Numerous punctate chronic microhemorrhages in the left greater than right parieto-occipital regions with additional punctate microhemorrhages in the cerebellum  and bilateral frontal lobes. Bilateral frontal convexity superficial siderosis. Sulcal FLAIR hyperintensity along the posterior left frontal convexity (for example see series 6, image 29). No midline shift or visible mass lesion. No hydrocephalus. Vascular: Major arterial flow voids are maintained at the skull base. Skull and upper cervical spine: Normal marrow signal. Sinuses/Orbits: Clear sinuses.  No acute orbital findings. IMPRESSION: 1. Punctate acute left medial  occipital infarct. 2. Sulcal FLAIR hyperintensity along the posterior left frontal convexity. This could represent small volume of acute/recent subarachnoid hemorrhage, meningitis, or artifact. Recommend noncontrast head CT to better evaluate for acute/hyperdense hemorrhage. Consider lumbar puncture. 3. Numerous posterior predominant chronic microhemorrhages with bilateral frontal convexity superficial siderosis. 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 Chronic hypertensive hemorrhages or vasculitis are less likely differential considerations. Findings discussed with Dr. Arora via telephone at 7:27 p.m. Electronically Signed   By: Stevenson Elbe M.D.   On: 08/27/2023 20:00    Microbiology: Results for orders placed or performed during the hospital encounter of 02/07/19  SARS CORONAVIRUS 2 (TAT 6-24 HRS) Nasopharyngeal Nasopharyngeal Swab     Status: None   Collection Time: 02/07/19 11:21 PM   Specimen: Nasopharyngeal Swab  Result Value Ref Range Status   SARS Coronavirus 2 NEGATIVE NEGATIVE Final    Comment: (NOTE) SARS-CoV-2 target nucleic acids are NOT DETECTED. The SARS-CoV-2 RNA is generally detectable in upper and lower respiratory specimens during the acute phase of infection. Negative results do not preclude SARS-CoV-2 infection, do not rule out co-infections with other pathogens, and should not be used as the sole basis for treatment or other patient management decisions. Negative results must be combined with clinical observations, patient history, and epidemiological information. The expected result is Negative. Fact Sheet for Patients: HairSlick.no Fact Sheet for Healthcare Providers: quierodirigir.com This test is not yet approved or cleared by the [place]  FDA and  has been authorized for detection and/or diagnosis of SARS-CoV-2 by FDA under an Emergency Use Authorization (EUA). This EUA will remain  in effect  (meaning this test can be used) for the duration of the COVID-19 declaration under Section 56 4(b)(1) of the Act, 21 U.S.C. section 360bbb-3(b)(1), unless the authorization is terminated or revoked sooner. Performed at [facility], [address] [place], [place] [address]     Labs: CBC: Recent Labs  Lab 08/27/23 1624 08/28/23 0658  WBC 4.5 4.6  NEUTROABS 2.4  --   HGB 14.6 13.8  HCT 43.8 41.0  MCV 97.8 96.9  PLT 238 224   Basic Metabolic Panel: Recent Labs  Lab 08/27/23 1624 08/28/23 0658  NA 139 140  K 3.9 3.6  CL 107 107  CO2 26 27  GLUCOSE 98 92  BUN 17 13  CREATININE 1.11 0.95  CALCIUM  9.3 8.9   Liver Function Tests: Recent Labs  Lab 08/27/23 1624  AST 22  ALT 20  ALKPHOS 63  BILITOT 0.6  PROT 6.8  ALBUMIN 3.9   CBG: No results for input(s): "GLUCAP" in the last 168 hours.  Discharge time spent: greater than 30 minutes.  Signed: MDALA-GAUSI, Zyden Suman AGATHA, MD [facility] 08/29/2023

## 2023-08-29 NOTE — Progress Notes (Signed)
 STROKE TEAM PROGRESS NOTE   BRIEF HPI Joshua Mayer. is a 68 y.o. 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  SIGNIFICANT HOSPITAL EVENTS 5/4: - Presented to ED with complete resolution of symptoms.   - MRI brain with punctate acute left medial occipital infarct.  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   - CTA head and neck without acute intracranial abnormality, no LVO or proximal hemodynamically significant stenosis.  INTERIM HISTORY/SUBJECTIVE No acute overnight events, wife and daughter at bedside Patient's wife and daughter claimed that patient has had some memory deficits recently as well as more tired than usual.  They also endorse statin intolerance with discontinuation of statin in the past year or so due to side effects of mental fogginess.  Further history obtained at bedside. Patient reports yesterday while hitting of balls in the backyard, his tongue became numb and he had slurred speech with the 3rd and 4th digits of the right hand numbness and confusion that lasted approximately 5 minutes before resolution.  Patient had a similar episode about 5 years ago when he was diagnosed with an ischemic stroke.  A loop recorder was placed at that time but without arrhythmia on interrogation.  OBJECTIVE CBC    Component Value Date/Time   WBC 4.6 08/28/2023 0658   RBC 4.23 08/28/2023 0658   HGB 13.8 08/28/2023 0658   HCT 41.0 08/28/2023 0658   PLT 224 08/28/2023 0658   MCV 96.9 08/28/2023 0658   MCH 32.6 08/28/2023 0658   MCHC 33.7 08/28/2023 0658   RDW 12.7 08/28/2023 0658   LYMPHSABS 1.4 08/27/2023 1624   MONOABS 0.6 08/27/2023 1624   EOSABS 0.1 08/27/2023 1624    BASOSABS 0.1 08/27/2023 1624   BMET    Component Value Date/Time   NA 140 08/28/2023 0658   K 3.6 08/28/2023 0658   CL 107 08/28/2023 0658   CO2 27 08/28/2023 0658   GLUCOSE 92 08/28/2023 0658   GLUCOSE 101 (H) 03/22/2006 0745   BUN 13 08/28/2023 0658   CREATININE 0.95 08/28/2023 0658   CREATININE 1.03 04/06/2020 0835   CALCIUM  8.9 08/28/2023 0658   GFRNONAA >60 08/28/2023 0658   GFRNONAA 76 04/06/2020 0835   Lab Results  Component Value Date   HGBA1C 5.0 08/28/2023   Lab Results  Component Value Date   CHOL 181 08/28/2023   HDL 47 08/28/2023   LDLCALC 114 (H) 08/28/2023   LDLDIRECT 148.2 03/22/2006   TRIG 100 08/28/2023   CHOLHDL 3.9 08/28/2023   IMAGING past 24 hours ECHOCARDIOGRAM COMPLETE Result Date: 08/28/2023    ECHOCARDIOGRAM REPORT   Patient Name:   Joshua [facility]. Date of Exam: 08/28/2023 Medical Rec #:  [id number]                 Height:       73.0 in Accession #:    [id number]                Weight:       195.5 lb Date of Birth:  07/27/1955                 BSA:          2.131 m Patient Age:    67 years  BP:           116/101 mmHg Patient Gender: M                         HR:           55 bpm. Exam Location:  Inpatient Procedure: 2D Echo, Cardiac Doppler and Color Doppler (Both Spectral and Color            Flow Doppler were utilized during procedure). Indications:    Stroke  History:        Patient has prior history of Echocardiogram examinations, most                 recent 02/08/2019. Stroke.  Sonographer:    Andrena Bang Referring Phys: Jacklynn Mask, S IMPRESSIONS  1. Left ventricular ejection fraction, by estimation, is 60 to 65%. The left ventricle has normal function. The left ventricle has no regional wall motion abnormalities. Left ventricular diastolic parameters were normal.  2. Right ventricular systolic function is normal. The right ventricular size is normal.  3. The mitral valve is grossly normal. Trivial mitral valve regurgitation.  No evidence of mitral stenosis.  4. The aortic valve is tricuspid. Aortic valve regurgitation is not visualized. No aortic stenosis is present.  5. The inferior vena cava is normal in size with greater than 50% respiratory variability, suggesting right atrial pressure of 3 mmHg. Comparison(s): No significant change from prior study. Conclusion(s)/Recommendation(s): Normal biventricular function without evidence of hemodynamically significant valvular heart disease. FINDINGS  Left Ventricle: Left ventricular ejection fraction, by estimation, is 60 to 65%. The left ventricle has normal function. The left ventricle has no regional wall motion abnormalities. The left ventricular internal cavity size was normal in size. There is  borderline left ventricular hypertrophy. Left ventricular diastolic parameters were normal. Right Ventricle: The right ventricular size is normal. Right vetricular wall thickness was not well visualized. Right ventricular systolic function is normal. Left Atrium: Left atrial size was normal in size. Right Atrium: Right atrial size was normal in size. Pericardium: There is no evidence of pericardial effusion. Mitral Valve: The mitral valve is grossly normal. Trivial mitral valve regurgitation. No evidence of mitral valve stenosis. Tricuspid Valve: The tricuspid valve is normal in structure. Tricuspid valve regurgitation is trivial. No evidence of tricuspid stenosis. Aortic Valve: The aortic valve is tricuspid. Aortic valve regurgitation is not visualized. No aortic stenosis is present. Aortic valve mean gradient measures 3.0 mmHg. Aortic valve peak gradient measures 6.7 mmHg. Aortic valve area, by VTI measures 3.69 cm. Pulmonic Valve: The pulmonic valve was not well visualized. Pulmonic valve regurgitation is trivial. No evidence of pulmonic stenosis. Aorta: The aortic root, ascending aorta, aortic arch and descending aorta are all structurally normal, with no evidence of dilitation or  obstruction. Ascending aorta measurements are within normal limits for age when indexed to body surface area. Venous: The inferior vena cava is normal in size with greater than 50% respiratory variability, suggesting right atrial pressure of 3 mmHg. IAS/Shunts: The atrial septum is grossly normal.  LEFT VENTRICLE PLAX 2D LVIDd:         5.20 cm      Diastology LVIDs:         3.30 cm      LV e' medial:    7.07 cm/s LV PW:         0.90 cm      LV E/e' medial:  9.2 LV IVS:  1.20 cm      LV e' lateral:   11.00 cm/s LVOT diam:     2.40 cm      LV E/e' lateral: 5.9 LV SV:         88 LV SV Index:   41 LVOT Area:     4.52 cm  LV Volumes (MOD) LV vol d, MOD A2C: 120.0 ml LV vol d, MOD A4C: 89.0 ml LV vol s, MOD A2C: 41.6 ml LV vol s, MOD A4C: 30.4 ml LV SV MOD A2C:     78.4 ml LV SV MOD A4C:     89.0 ml LV SV MOD BP:      69.9 ml RIGHT VENTRICLE RV S prime:     12.30 cm/s TAPSE (M-mode): 2.4 cm LEFT ATRIUM             Index LA diam:        4.30 cm 2.02 cm/m LA Vol (A2C):   36.0 ml 16.89 ml/m LA Vol (A4C):   36.4 ml 17.08 ml/m LA Biplane Vol: 35.8 ml 16.80 ml/m  AORTIC VALVE AV Area (Vmax):    2.88 cm AV Area (Vmean):   3.09 cm AV Area (VTI):     3.69 cm AV Vmax:           129.00 cm/s AV Vmean:          73.400 cm/s AV VTI:            0.239 m AV Peak Grad:      6.7 mmHg AV Mean Grad:      3.0 mmHg LVOT Vmax:         82.20 cm/s LVOT Vmean:        50.100 cm/s LVOT VTI:          0.195 m LVOT/AV VTI ratio: 0.82  AORTA Ao Asc diam: 4.00 cm MITRAL VALVE MV Area (PHT): 2.54 cm    SHUNTS MV Decel Time: 299 msec    Systemic VTI:  0.20 m MV E velocity: 65.00 cm/s  Systemic Diam: 2.40 cm MV A velocity: 79.70 cm/s MV E/A ratio:  0.82 Sheryle Donning MD Electronically signed by Sheryle Donning MD Signature Date/Time: 08/28/2023/6:37:54 PM    Final    EEG adult Result Date: 08/28/2023 Arleene Lack, MD     08/28/2023 11:54 AM Patient Name: Joshua Mayer. MRN: [id number] Epilepsy Attending: Arleene Lack Referring Physician/Provider: Louretta Royals, NP Date: 08/28/2023 Duration: 22.20 mins Patient history:  68 y.o. male with prior right temporal punctate infarct with no residual deficits, cardiac monitoring unremarkable for any evidence of arrhythmia, presenting for evaluation of tongue numbness. EEG to evaluate for seizure Level of alertness: Awake AEDs during EEG study: None Technical aspects: This EEG study was done with scalp electrodes positioned according to the 10-20 International system of electrode placement. Electrical activity was reviewed with band pass filter of 1-70Hz , sensitivity of 7 uV/mm, display speed of 63mm/sec with a 60Hz  notched filter applied as appropriate. EEG data were recorded continuously and digitally stored.  Video monitoring was available and reviewed as appropriate. Description: The posterior dominant rhythm consists of 8 Hz activity of moderate voltage (25-35 uV) seen predominantly in posterior head regions, symmetric and reactive to eye opening and eye closing. Hyperventilation and photic stimulation were not performed.   IMPRESSION: This study is within normal limits. No seizures or epileptiform discharges were seen throughout the recording. A normal interictal EEG does not exclude the diagnosis  of epilepsy. Priyanka Suzanne Erps   Vitals:   08/28/23 1946 08/29/23 0021 08/29/23 0416 08/29/23 0729  BP: 128/76 (!) 122/92 122/68 128/79  Pulse: 60 (!) 51 (!) 54 (!) 51  Resp: 18 17 18 17   Temp: 98.2 F (36.8 C) 98.2 F (36.8 C) 97.9 F (36.6 C) (!) 97.4 F (36.3 C)  TempSrc: Oral Oral Oral Oral  SpO2: 96% 98% 98% 98%   PHYSICAL EXAM General:  Alert, well-nourished, well-developed patient in no acute distress Psych:  Mood and affect appropriate for situation.  Patient is calm cooperative with exam. CV: Sinus bradycardia cardia on monitor. Respiratory:  Regular, unlabored respirations on room air GI: Abdomen soft and nontender  NEURO:  Mental Status: AA&Ox3,  patient is able to give clear and coherent history of presentation.   Speech/Language: speech is without dysarthria or aphasia.  Naming, repetition, and fluency are intact.  He is able to do simple addition calculations but has impaired recall only able to name 5 four-legged animals with 1 repetition.  Immediate recall of 3 objects intact, 0 recall at 2 minutes.  Clock drawing is intact.  Cranial Nerves:  II: PERRL. Visual fields full.  III, IV, VI: EOMI. Eyelids elevate symmetrically.  V: Sensation is intact to light touch and symmetrical to face.  VII: Face is symmetrical resting and with movement VIII: Hearing is intact to voice. IX, X: Phonation is normal.  XI: Shoulders shrug symmetrically XII: Tongue protrudes midline Motor: 5/5 strength to all muscle groups tested without unilateral weakness or asymmetry. Tone: is normal and bulk is normal Sensation: Intact to light touch bilaterally. Extinction absent to light touch to DSS.   Coordination: FTN intact bilaterally, HKS: no ataxia in BLE. No drift.  Gait: Deferred  ASSESSMENT/PLAN Punctate left occipital lacunar infarction, likely an incidental stroke finding as symptoms do not correlate with area of infarct Etiology:  likely small vessel disease   CTA head & neck: No evidence of acute intracranial abnormality.  No LVO or proximal hemodynamically significant stenosis. MRI punctate acute left medial occipital infarct.  A sulcal FLAIR hyperintensity along the posterior left frontal convexity.  This could represent small volume of acute/recent subarachnoid hemorrhage, meningitis, or artifact.  Recommend noncontrast head CT to better evaluate for acute/hyperdense hemorrhage.  Consider lumbar puncture.  Numerous posterior predominant chronic microhemorrhages with bilateral frontal convexity superficial siderosis.  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  Chronic hypertensive hemorrhages or vasculitis are less likely differential  considerations. 2D Echo ejection fraction 60 to 65%.  Left atrial size was normal LDL 114 HgbA1c 5.0 EEG no seizure activity VTE prophylaxis - SCDs aspirin  81 mg daily prior to admission, now on clopidogrel  75 mg daily. Will not initiate DAPT at this time as there is some imaging findings concerning for CAA and microhemorrhages.  Therapy recommendations:  No follow up needed  Disposition:  pending  Hx of Stroke/TIA Seen in October 2020 with fluctuating but transient left extremity weakness and numbness 02/09/2019 MRI brain with punctate acute infarct of the lateral right temporal lobe with remote tiny acute infarcts along the surface of the insula and at the right temporoparietal junction concerning for embolic infarctions in the right MCA territory Discharged on statin and 3 weeks of DAPT followed by aspirin  monotherapy Loop recorder placed without evidence of arrhythmia Echocardiogram 02/08/2019 LVEF 60 to 65% Reported residual mild short-term memory impairment  Hypertension Home meds: None Stable Blood Pressure Goal: SBP less than 160   Hyperlipidemia Home meds:  none LDL  114, goal < 70 Add Zetia 10 mg PO daily due to previous statin intolerance Can consider injectable medications on an outpatient basis if Zetia does not effectively lower LDL  Other Stroke Risk Factors Family hx stroke (mother and father)  Other Active Problems Concern for possible CAA Patient and family report recent mild memory complaints Reversible causes of dementia labs ordered: B12, B1, TSH, RPR, HIV as well as routine EEG  Hospital day # 0   He presented with transient 15-minute episode of tongue paresthesias and numbness, speech difficulties and right hand paresthesias with MRI scan showing tiny punctate left parietal periatrial white matter lacunar infarct likely from small vessel disease.  Patient also has multiple microhemorrhages and changes of superficial siderosis raising concern for amyloid  angiopathy.  He does have mild cognitive impairment and vitamin B12 level is low recommend replacement as per primary team.  EEG and echo are unremarkable..  Recommend Plavix  alone for stroke prevention given multiple microhemorrhages in the brain and aggressive risk factor modification.  Long discussion with patient and wife and answered questions.  Follow-up as an patient with stroke clinic in 2 months.  Stroke team will sign off.  Greater than 50% time during this 35-minute visit was spent in counseling and coordination of care and discussion patient and wife care team and answering questions.D/W Dr Madaline Scales  Ardella Beaver, MD Medical Director [facility] Pager: [phone] 08/29/2023 10:46 AM   To contact Stroke Continuity provider, please refer to [email or web address]. After hours, contact General Neurology

## 2023-08-29 NOTE — Plan of Care (Signed)

## 2023-08-29 NOTE — Progress Notes (Addendum)
 Explained discharge instructions to patient. Reviewed follow up appointment and next medication administration times. Also reviewed education. Patient verbalized having an understanding for instructions given. All belongings are in the patient's possession. IV and telemetry were removed. CCMD was notified. No other needs verbalized. Wife is at the bedside. Volunteer to [employer].

## 2023-08-29 NOTE — TOC CAGE-AID Note (Signed)
 Transition of Care (TOC) - CAGE-AID Screening   Patient Details  Name: Joshua EDWARD Cargle Jr. MRN: [id number] Date of Birth: 1956-02-13  [facility] of [facility] CM/SW Contact:    Randell Bussing, LCSW Phone Number: 08/29/2023, 9:41 AM   Clinical Narrative: Per chart review - patient with memory impairment. No substance use noted per chart review.   CAGE-AID Screening: Substance Abuse Screening unable to be completed due to: : Patient unable to participate (memory impairment)

## 2023-08-29 NOTE — Plan of Care (Signed)

## 2023-08-29 NOTE — Progress Notes (Signed)
 Patient called facility to report his medications had not been sent to his pharmacy. I called the pharmacy ([facility] [employer], [address], [place]) and was told they had a glitch in their computer system this morning and all scripts sent before 11:00 were deleted. Medications have been reordered.

## 2023-08-31 LAB — VITAMIN B1: Vitamin B1 (Thiamine): 103.6 nmol/L (ref 66.5–200.0)

## 2023-09-02 ENCOUNTER — Telehealth: Payer: Self-pay | Admitting: Neurology

## 2023-09-02 NOTE — Telephone Encounter (Signed)
 Patient called reporting slight numbness to face and tongue, the same symptoms that he experienced last Sunday requiring admission. Today symptoms lasted only a few minutes. He took his Plavix . Advised family to increase water intake and if he has additional or new symptoms to present to the ED.    Thanks  Dr. Yannick Steuber

## 2023-09-03 ENCOUNTER — Encounter: Payer: Self-pay | Admitting: Family Medicine

## 2023-11-16 ENCOUNTER — Inpatient Hospital Stay: Payer: Self-pay | Admitting: Neurology

## 2024-02-22 ENCOUNTER — Encounter: Payer: Self-pay | Admitting: Neurology

## 2024-02-22 ENCOUNTER — Ambulatory Visit (INDEPENDENT_AMBULATORY_CARE_PROVIDER_SITE_OTHER): Payer: Self-pay | Admitting: Neurology

## 2024-02-22 VITALS — BP 118/84 | HR 54 | Ht 73.0 in | Wt 210.0 lb

## 2024-02-22 DIAGNOSIS — I680 Cerebral amyloid angiopathy: Secondary | ICD-10-CM

## 2024-02-22 DIAGNOSIS — G459 Transient cerebral ischemic attack, unspecified: Secondary | ICD-10-CM

## 2024-02-22 DIAGNOSIS — R930 Abnormal findings on diagnostic imaging of skull and head, not elsewhere classified: Secondary | ICD-10-CM

## 2024-02-22 NOTE — Patient Instructions (Signed)
 I had a long d/w patient and his wife about his recent TIA and abnormal brain MRI findings raising concern for cerebral amyloid angiopathy, risk for recurrent stroke/TIAs, personally independently reviewed imaging studies and stroke evaluation results and answered questions.Continue Plavix  75 mg daily alone for secondary stroke prevention and maintain strict control of hypertension with blood pressure goal below 130/90, diabetes with hemoglobin A1c goal below 6.5% and lipids with LDL cholesterol goal below 70 mg/dL. I also advised the patient to eat a healthy diet with plenty of whole grains, cereals, fruits and vegetables, exercise regularly and maintain ideal body weight .I encouraged him to increase participation in cognitively challenging activities like solving crossword puzzles, playing bridge and sudoku.  We also discussed memory compensation strategies.  Followup in the future with me in 6 to 8 months or call earlier if necessary.  Memory Compensation Strategies  Use WARM strategy.  W= write it down  A= associate it  R= repeat it  M= make a mental note  2.   You can keep a Glass Blower/designer.  Use a 3-ring notebook with sections for the following: calendar, important names and phone numbers,  medications, doctors' names/phone numbers, lists/reminders, and a section to journal what you did  each day.   3.    Use a calendar to write appointments down.  4.    Write yourself a schedule for the day.  This can be placed on the calendar or in a separate section of the Memory Notebook.  Keeping a  regular schedule can help memory.  5.    Use medication organizer with sections for each day or morning/evening pills.  You may need help loading it  6.    Keep a basket, or pegboard by the door.  Place items that you need to take out with you in the basket or on the pegboard.  You may also want to  include a message board for reminders.  7.    Use sticky notes.  Place sticky notes with reminders in  a place where the task is performed.  For example:  turn off the  stove placed by the stove, lock the door placed on the door at eye level,  take your medications on  the bathroom mirror or by the place where you normally take your medications.  8.    Use alarms/timers.  Use while cooking to remind yourself to check on food or as a reminder to take your medicine, or as a  reminder to make a call, or as a reminder to perform another task, etc.

## 2024-02-22 NOTE — Progress Notes (Signed)
 [facility] [address] [place]. [place] [address] [phone]       OFFICE FOLLOW-UP NOTE  Mr. Joshua Codner Debose Jr. Date of Birth:  January 26, 1956 Medical Record Number:  [id number]   HPI: Joshua Mayer is a 68 year old pleasant Caucasian male seen today for initial office follow-up visit following hospital consultation for stroke in May 2025.  He is accompanied by his wife.  History is obtained from them and review of electronic medical records.  I personally reviewed pertinent available imaging films in PACS.  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  He presented this time on 5//2025 with a very brief episode of numbness involving his tongue.  Symptoms lasted less than a minute completely resolved.  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  Patient denied any significant cognitive impairment.  CT angiogram of the brain and neck showed no large vessel stenosis or occlusion.  Transthoracic echo showed ejection fraction of 60 to 65%.  Left atrial size was normal.  Patient previously had a loop recorder inserted following his stroke in 2020 and no evidence of atrial fibrillation had yet been found.  LDL cholesterol send and 40 mg percent.  Hemoglobin A1c was 5.0.  EEG was normal.  Patient was previously on aspirin  which was switched to Plavix  alone given significant microhemorrhages and increased bleeding risk.  Patient states has done well since discharge.  He is tolerating Plavix  well without bruising or bleeding.  His blood pressure is under good  control.  He is on Zetia  10 mg which is tolerating well without side effects.  He is not on statin due to history of statin intolerance.  Patient has got hearing aids and can hear much better.  He has also been started on B12 supplements which has helped with his fatigue symptoms as well.  He has no cognitive complaints and is independent in all activities of daily living.  ROS:   14 system review of systems is positive for numbness decreased hearing only all other systems negative  PMH:  Past Medical History:  Diagnosis Date   Bell's palsy     Social History:  Social History   Socioeconomic History   Marital status: Married    Spouse name: Not on file   Number of children: Not on file   Years of education: Not on file   Highest education level: Not on file  Occupational History   Not on file  Tobacco Use   Smoking status: Never   Smokeless tobacco: Never  Substance and Sexual Activity   Alcohol use: Not Currently   Drug use: Not on file   Sexual activity: Not on file  Other Topics Concern   Not on file  Social History Narrative   Not on file   Social Drivers of Health   Financial Resource Strain: Not on file  Food Insecurity: No Food Insecurity (08/28/2023)   Hunger Vital Sign    Worried About Running Out of Food in the Last Year: Never true    Ran Out of Food in the Last Year: Never true  Transportation Needs: No Transportation Needs (08/28/2023)  PRAPARE - Administrator, Civil Service (Medical): No    Lack of Transportation (Non-Medical): No  Physical Activity: Not on file  Stress: Not on file  Social Connections: Unknown (08/28/2023)   Social Connection and Isolation Panel    Frequency of Communication with Friends and Family: More than three times a week    Frequency of Social Gatherings with Friends and Family: More than three times a week    Attends Religious Services: Patient declined    Database Administrator or Organizations: Yes    Attends Probation Officer: More than 4 times per year    Marital Status: Married  Catering Manager Violence: Not At Risk (08/28/2023)   Humiliation, Afraid, Rape, and Kick questionnaire    Fear of Current or Ex-Partner: No    Emotionally Abused: No    Physically Abused: No    Sexually Abused: No    Medications:   Current Outpatient Medications on File Prior to Visit  Medication Sig Dispense Refill   clopidogrel  (PLAVIX ) 75 MG tablet Take 1 tablet (75 mg total) by mouth daily. 30 tablet 11   co-enzyme Q-10 30 MG capsule Take 30 mg by mouth daily.     Cyanocobalamin  (B-12) 5000 MCG CAPS Take 1 capsule by mouth daily.     ezetimibe  (ZETIA ) 10 MG tablet Take 1 tablet (10 mg total) by mouth daily. 30 tablet 11   fluticasone  (CUTIVATE ) 0.05 % cream APPLY TOPICALLY 2 (TWO) TIMES DAILY AS NEEDED. 30 g 0   No current facility-administered medications on file prior to visit.    Allergies:  No Known Allergies  Physical Exam General: well developed, well nourished, seated, in no evident distress Head: head normocephalic and atraumatic.  Neck: supple with no carotid or supraclavicular bruits Cardiovascular: regular rate and rhythm, no murmurs Musculoskeletal: no deformity Skin:  no rash/petichiae Vascular:  Normal pulses all extremities Vitals:   02/22/24 0916  BP: 118/84  Pulse: (!) 54  SpO2: 91%   Neurologic Exam Mental Status: Awake and fully alert. Oriented to place and time. Recent and remote memory intact. Attention span, concentration and fund of knowledge appropriate. Mood and affect appropriate.  Recall 3/3.  Mini-Mental status exam 23/30.  Geriatric depression scale not depressed.  Able to name only 3 animals which can walk on forelegs.  Clock drawing 3/4. Cranial Nerves: Fundoscopic exam reveals sharp disc margins. Pupils equal, briskly reactive to light. Extraocular movements full without nystagmus. Visual fields full to confrontation. Hearing intact. Facial sensation intact.  Face, tongue, palate moves normally and symmetrically.  Motor: Normal bulk and tone. Normal strength in all tested extremity muscles. Sensory.: intact to touch ,pinprick .position and vibratory sensation.  Coordination: Rapid alternating movements normal in all extremities. Finger-to-nose and heel-to-shin performed accurately bilaterally. Gait and Station: Arises from chair without difficulty. Stance is normal. Gait demonstrates normal stride length and balance . Able to heel, toe and tandem walk without difficulty.  Reflexes: 1+ and symmetric. Toes downgoing.   NIHSS  0 Modified Rankin  0    02/22/2024    9:40 AM  MMSE - Mini Mental State Exam  Orientation to time 3  Orientation to Place 5  Registration 3  Attention/ Calculation 3  Recall 0  Language- name 2 objects 2  Language- repeat 1  Language- follow 3 step command 3  Language- read & follow direction 1  Write a sentence 1  Copy design 1  Total score 23  ASSESSMENT: 68 year old Caucasian male with history of TIA in May 2025 with silent left medial occipital infarct with abnormal MRI suggestive of cerebral amyloid angiopathy.  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     PLAN:I had a long d/w patient and his wife about his recent TIA and abnormal brain MRI findings raising concern for cerebral amyloid angiopathy, risk for recurrent stroke/TIAs, personally independently reviewed imaging studies and stroke evaluation results and answered questions.Continue Plavix  75 mg daily alone for secondary stroke prevention and maintain strict control of hypertension with blood pressure goal below 130/90, diabetes with hemoglobin A1c goal below 6.5% and lipids with LDL cholesterol goal below 70 mg/dL. I also advised the patient to eat a healthy diet with plenty of whole grains, cereals, fruits and vegetables, exercise regularly and maintain ideal body weight .  I informed them that they should stay away from very strong  blood thinners due to increased risk of brain hemorrhage..I encouraged him to increase participation in cognitively challenging activities like solving crossword puzzles, playing bridge and sudoku.  We also discussed memory compensation strategies.  We discussed the risk of progression to dementia and absence of significant medications or interventions which could prevent this.  Followup in the future with me in 6 to 8 months or call earlier if necessary.    I personally spent a total of 40 minutes in the care of the patient today including getting/reviewing separately obtained history, performing a medically appropriate exam/evaluation, counseling and educating, placing orders, referring and communicating with other health care professionals, documenting clinical information in the EHR, independently interpreting results, and coordinating care.     Eather Popp, MD   Note: This document was prepared with digital dictation and possible smart phrase technology. Any transcriptional errors that result from this process are unintentional

## 2025-01-08 ENCOUNTER — Ambulatory Visit: Payer: Self-pay | Admitting: Neurology
